# Patient Record
Sex: Female | Born: 1960 | Race: Black or African American | Hispanic: No | Marital: Married | Smoking: Current every day smoker
Health system: Southern US, Community
[De-identification: ages and names within clinical notes are randomized; demographics above are authoritative.]

## PROBLEM LIST (undated history)

## (undated) DIAGNOSIS — E7800 Pure hypercholesterolemia, unspecified: Secondary | ICD-10-CM

## (undated) DIAGNOSIS — J302 Other seasonal allergic rhinitis: Secondary | ICD-10-CM

## (undated) DIAGNOSIS — I10 Essential (primary) hypertension: Secondary | ICD-10-CM

## (undated) DIAGNOSIS — K219 Gastro-esophageal reflux disease without esophagitis: Secondary | ICD-10-CM

## (undated) HISTORY — DX: Pure hypercholesterolemia, unspecified: E78.00

## (undated) HISTORY — PX: APPENDECTOMY: SHX54

## (undated) HISTORY — DX: Gastro-esophageal reflux disease without esophagitis: K21.9

## (undated) HISTORY — DX: Essential (primary) hypertension: I10

---

## 2000-12-29 ENCOUNTER — Encounter: Payer: Self-pay | Admitting: General Surgery

## 2000-12-29 ENCOUNTER — Ambulatory Visit (HOSPITAL_COMMUNITY): Admission: RE | Admit: 2000-12-29 | Discharge: 2000-12-29 | Payer: Self-pay | Admitting: General Surgery

## 2002-11-07 ENCOUNTER — Ambulatory Visit (HOSPITAL_COMMUNITY): Admission: RE | Admit: 2002-11-07 | Discharge: 2002-11-07 | Payer: Self-pay | Admitting: General Surgery

## 2002-11-07 ENCOUNTER — Encounter: Payer: Self-pay | Admitting: General Surgery

## 2003-11-12 ENCOUNTER — Ambulatory Visit (HOSPITAL_COMMUNITY): Admission: RE | Admit: 2003-11-12 | Discharge: 2003-11-12 | Payer: Self-pay | Admitting: Obstetrics and Gynecology

## 2004-01-17 ENCOUNTER — Emergency Department (HOSPITAL_COMMUNITY): Admission: EM | Admit: 2004-01-17 | Discharge: 2004-01-17 | Payer: Self-pay | Admitting: Emergency Medicine

## 2004-11-23 ENCOUNTER — Ambulatory Visit (HOSPITAL_COMMUNITY): Admission: RE | Admit: 2004-11-23 | Discharge: 2004-11-23 | Payer: Self-pay | Admitting: General Surgery

## 2005-12-23 ENCOUNTER — Ambulatory Visit (HOSPITAL_COMMUNITY): Admission: RE | Admit: 2005-12-23 | Discharge: 2005-12-23 | Payer: Self-pay | Admitting: Nurse Practitioner

## 2006-07-11 ENCOUNTER — Emergency Department (HOSPITAL_COMMUNITY): Admission: EM | Admit: 2006-07-11 | Discharge: 2006-07-11 | Payer: Self-pay | Admitting: Emergency Medicine

## 2007-01-09 ENCOUNTER — Ambulatory Visit (HOSPITAL_COMMUNITY): Admission: RE | Admit: 2007-01-09 | Discharge: 2007-01-09 | Payer: Self-pay | Admitting: Nurse Practitioner

## 2008-04-03 ENCOUNTER — Ambulatory Visit (HOSPITAL_COMMUNITY): Admission: RE | Admit: 2008-04-03 | Discharge: 2008-04-03 | Payer: Self-pay | Admitting: Nurse Practitioner

## 2009-04-17 ENCOUNTER — Ambulatory Visit (HOSPITAL_COMMUNITY)
Admission: RE | Admit: 2009-04-17 | Discharge: 2009-04-17 | Payer: Self-pay | Source: Home / Self Care | Admitting: Nurse Practitioner

## 2010-04-03 ENCOUNTER — Other Ambulatory Visit (HOSPITAL_COMMUNITY): Payer: Self-pay | Admitting: Nurse Practitioner

## 2010-04-03 DIAGNOSIS — Z1239 Encounter for other screening for malignant neoplasm of breast: Secondary | ICD-10-CM

## 2010-04-05 ENCOUNTER — Encounter: Payer: Self-pay | Admitting: Nurse Practitioner

## 2010-04-20 ENCOUNTER — Ambulatory Visit (HOSPITAL_COMMUNITY): Admission: RE | Admit: 2010-04-20 | Payer: Self-pay | Source: Home / Self Care | Admitting: Nurse Practitioner

## 2010-04-20 ENCOUNTER — Ambulatory Visit (HOSPITAL_COMMUNITY): Payer: Self-pay

## 2010-04-20 ENCOUNTER — Ambulatory Visit (HOSPITAL_COMMUNITY)
Admission: RE | Admit: 2010-04-20 | Discharge: 2010-04-20 | Disposition: A | Discharge status: Home / Self Care | Payer: BC Managed Care – PPO | Source: Ambulatory Visit | Attending: Nurse Practitioner | Admitting: Nurse Practitioner

## 2010-04-20 DIAGNOSIS — Z1231 Encounter for screening mammogram for malignant neoplasm of breast: Secondary | ICD-10-CM | POA: Insufficient documentation

## 2010-04-20 DIAGNOSIS — Z1239 Encounter for other screening for malignant neoplasm of breast: Secondary | ICD-10-CM

## 2010-06-16 ENCOUNTER — Telehealth: Payer: Self-pay

## 2010-06-16 NOTE — Telephone Encounter (Signed)
Gastroenterology Pre-Procedure Form  Request Date: 06/11/2010,  Requesting Physician: Ninfa Linden fnp     PATIENT INFORMATION:  Jennifer Browning is a 50 y.o., female (DOB=Apr 15, 1960).  PROCEDURE: Procedure(s) requested: colonoscopy Procedure Reason: screening for colon cancer  PATIENT REVIEW QUESTIONS: The patient reports the following:   1. Diabetes Melitis: no 2. Joint replacements in the past 12 months: no 3. Major health problems in the past 3 months: no 4. Has an artificial valve or MVP:no 5. Has been advised in past to take antibiotics in advance of a procedure like teeth cleaning: no}    MEDICATIONS & ALLERGIES:    Patient reports the following regarding taking any blood thinners:   Plavix? no Aspirin?yes 81 mg Coumadin?  no  Patient confirms/reports the following medications:  Current Outpatient Prescriptions  Medication Sig Dispense Refill  . amLODipine-benazepril (LOTREL) 10-40 MG per capsule Take 1 capsule by mouth daily.        Marland Kitchen aspirin 81 MG tablet Take 81 mg by mouth daily.        Marland Kitchen FLUoxetine (PROZAC) 20 MG capsule Take 20 mg by mouth daily.        Marland Kitchen ibuprofen (ADVIL,MOTRIN) 200 MG tablet Take 200 mg by mouth every 6 (six) hours as needed. Just as needed       . losartan (COZAAR) 100 MG tablet Take 100 mg by mouth daily.        . metoprolol tartrate (LOPRESSOR) 25 MG tablet Take 25 mg by mouth daily.        Marland Kitchen omeprazole (PRILOSEC) 20 MG capsule Take 20 mg by mouth daily.        . pravastatin (PRAVACHOL) 20 MG tablet Take 20 mg by mouth daily.          Patient confirms/reports the following allergies:  No Known Allergies  Patient is appropriate to schedule for requested procedure(s): yes  AUTHORIZATION INFORMATION Primary Insurance: ,  ID #: ,  Group #:  Pre-Cert / Auth required: n/a Pre-Cert / Auth #:     Orders Placed This Encounter  Procedures  . Endoscopy, colon, diagnostic    Standing Status: Future     Number of Occurrences:    Standing Expiration Date: 06/16/2011    Order Specific Question:  Pre-op diagnosis    Answer:  screenng    Order Specific Question:  Pre-op visit required?    Answer:  No [0]    SCHEDULE INFORMATION: Procedure has been scheduled as follows:  Date: 07/08/2010, Time: 9:45 AM  Location: [facility] Short Stay  This Gastroenterology Pre-Precedure Form is being routed to the following provider(s) for review: R. Roetta Sessions, MD

## 2010-06-21 NOTE — Telephone Encounter (Signed)
Ok as is; Reviewed by R. Michael Aanya Haynes, MD FACP FACG 

## 2010-06-21 NOTE — Telephone Encounter (Signed)
Reviewed by R. Michael Keelia Graybill, MD FACP FACG 

## 2010-06-23 NOTE — Telephone Encounter (Signed)
A user error has taken place: encounter opened in error, closed for administrative reasons.

## 2010-07-08 ENCOUNTER — Encounter: Payer: BC Managed Care – PPO | Admitting: Internal Medicine

## 2010-07-08 ENCOUNTER — Other Ambulatory Visit: Payer: Self-pay | Admitting: Internal Medicine

## 2010-07-08 ENCOUNTER — Ambulatory Visit (HOSPITAL_COMMUNITY)
Admission: RE | Admit: 2010-07-08 | Discharge: 2010-07-08 | Disposition: A | Discharge status: Home / Self Care | Payer: BC Managed Care – PPO | Source: Ambulatory Visit | Attending: Internal Medicine | Admitting: Internal Medicine

## 2010-07-08 DIAGNOSIS — Z1211 Encounter for screening for malignant neoplasm of colon: Secondary | ICD-10-CM

## 2010-07-08 DIAGNOSIS — D126 Benign neoplasm of colon, unspecified: Secondary | ICD-10-CM

## 2010-07-08 DIAGNOSIS — Z79899 Other long term (current) drug therapy: Secondary | ICD-10-CM | POA: Insufficient documentation

## 2010-07-08 DIAGNOSIS — I10 Essential (primary) hypertension: Secondary | ICD-10-CM | POA: Insufficient documentation

## 2010-07-08 DIAGNOSIS — Z7982 Long term (current) use of aspirin: Secondary | ICD-10-CM | POA: Insufficient documentation

## 2010-07-08 DIAGNOSIS — E785 Hyperlipidemia, unspecified: Secondary | ICD-10-CM | POA: Insufficient documentation

## 2010-07-08 HISTORY — PX: COLONOSCOPY: SHX174

## 2010-07-10 ENCOUNTER — Encounter: Payer: Self-pay | Admitting: Internal Medicine

## 2010-07-14 NOTE — Op Note (Signed)
  Jennifer Browning, Jennifer Browning         ACCOUNT NO.:  [id number]  MEDICAL RECORD NO.:  [id number]           PATIENT TYPE:  O  LOCATION:  [facility]                          FACILITY:  [facility]  PHYSICIAN:  R. Roetta Sessions, M.D. DATE OF BIRTH:  12/25/1960  DATE OF PROCEDURE:  07/08/2010 DATE OF DISCHARGE:                              OPERATIVE REPORT   PROCEDURE:  Colonoscopy with piecemeal snare polypectomy followed by hemostasis clipping.  INDICATIONS FOR PROCEDURE:  A 50 year old lady with no lower GI tract symptoms, sent over at the courtesy of Ninfa Linden, FNP for colorectal cancer screening.  No family history of polyps or colon cancer.  No prior imaging.  Colonoscopy is now being done as screening maneuver.  Risks, benefits, limitations, alternatives, imponderables have been reviewed today in the office.  Please see the documentation in the medical record for more information.  PROCEDURE NOTE:  O2 saturation, blood pressure, pulse, respirations were monitored throughout the entire procedure.  CONSCIOUS SEDATION:  Versed 6 mg IV, Demerol 100 mg IV in divided doses.  INSTRUMENT:  Pentax video chip system.  FINDINGS:  Digital rectal exam revealed no abnormalities.  Endoscopic findings:  Prep was good.  Colon:  Colonic mucosa was surveyed from the rectosigmoid junction through the left transverse right colon to the appendiceal orifice, ileocecal valve/cecum.  These structures were well seen and photographed for the record.  From this level, scope was slowly withdrawn.  All previously mentioned mucosal surfaces were again seen. The patient had 4 x 8 mm polyp straddling a fold just distal and at the base of the ileocecal valve.  Please see photos.  It is difficult to approach with the snare.  Ultimately, two passes with the snare were made to remove it and we had problems with good connections with the grounding and cautery was limited.  There was some oozing at the site which was  treated very effectively with the application one hemostasis clip.  Remainder of colonic mucosa appeared normal.  Scope was pulled down to the rectum where a thorough examination of rectal mucosa including retroflexed view of the anal verge demonstrated no abnormalities.  The patient tolerated the procedure well.  Cecal withdrawal time 18 minutes.  IMPRESSION:  Normal rectum, polyp at the ileocecal valve status post piecemeal snare polypectomy followed by resolution and clipping. Remainder of colonic mucosa appeared normal.  RECOMMENDATION: 1. No aspirin or arthritis medications for 7 days. 2. Follow up on path. 3. No MRI until clip known to have passed.     Jonathon Bellows, M.D.     RMR/MEDQ  D:  07/08/2010  T:  07/08/2010  Job:  [id number]  cc:   Ninfa Linden, FNP [place], [place]  Electronically Signed by Lorrin Goodell M.D. on 07/14/2010 07:51:38 PM

## 2011-03-18 ENCOUNTER — Other Ambulatory Visit (HOSPITAL_COMMUNITY): Payer: Self-pay | Admitting: Nurse Practitioner

## 2011-03-18 DIAGNOSIS — Z139 Encounter for screening, unspecified: Secondary | ICD-10-CM

## 2011-04-26 ENCOUNTER — Ambulatory Visit (HOSPITAL_COMMUNITY)
Admission: RE | Admit: 2011-04-26 | Discharge: 2011-04-26 | Disposition: A | Discharge status: Home / Self Care | Payer: BC Managed Care – PPO | Source: Ambulatory Visit | Attending: Nurse Practitioner | Admitting: Nurse Practitioner

## 2011-04-26 DIAGNOSIS — Z139 Encounter for screening, unspecified: Secondary | ICD-10-CM

## 2011-04-26 DIAGNOSIS — Z1231 Encounter for screening mammogram for malignant neoplasm of breast: Secondary | ICD-10-CM | POA: Insufficient documentation

## 2012-05-04 ENCOUNTER — Other Ambulatory Visit (HOSPITAL_COMMUNITY): Payer: Self-pay | Admitting: Nurse Practitioner

## 2012-05-09 ENCOUNTER — Ambulatory Visit (HOSPITAL_COMMUNITY)
Admission: RE | Admit: 2012-05-09 | Discharge: 2012-05-09 | Disposition: A | Discharge status: Home / Self Care | Payer: BC Managed Care – PPO | Source: Ambulatory Visit | Attending: Nurse Practitioner | Admitting: Nurse Practitioner

## 2012-05-09 DIAGNOSIS — Z1231 Encounter for screening mammogram for malignant neoplasm of breast: Secondary | ICD-10-CM | POA: Insufficient documentation

## 2013-05-29 ENCOUNTER — Other Ambulatory Visit (HOSPITAL_COMMUNITY): Payer: Self-pay | Admitting: Nurse Practitioner

## 2013-05-29 DIAGNOSIS — Z139 Encounter for screening, unspecified: Secondary | ICD-10-CM

## 2013-06-04 ENCOUNTER — Ambulatory Visit (HOSPITAL_COMMUNITY)
Admission: RE | Admit: 2013-06-04 | Discharge: 2013-06-04 | Disposition: A | Discharge status: Home / Self Care | Payer: BC Managed Care – PPO | Source: Ambulatory Visit | Attending: Nurse Practitioner | Admitting: Nurse Practitioner

## 2013-06-04 DIAGNOSIS — Z1231 Encounter for screening mammogram for malignant neoplasm of breast: Secondary | ICD-10-CM | POA: Insufficient documentation

## 2013-06-04 DIAGNOSIS — Z139 Encounter for screening, unspecified: Secondary | ICD-10-CM

## 2013-10-31 ENCOUNTER — Encounter: Payer: Self-pay | Admitting: Gastroenterology

## 2013-10-31 ENCOUNTER — Ambulatory Visit (INDEPENDENT_AMBULATORY_CARE_PROVIDER_SITE_OTHER): Payer: BC Managed Care – PPO | Admitting: Gastroenterology

## 2013-10-31 ENCOUNTER — Encounter (HOSPITAL_COMMUNITY): Payer: Self-pay | Admitting: Pharmacy Technician

## 2013-10-31 VITALS — BP 131/69 | HR 73 | Temp 98.1°F | Ht 67.0 in | Wt 183.6 lb

## 2013-10-31 DIAGNOSIS — K5900 Constipation, unspecified: Secondary | ICD-10-CM | POA: Insufficient documentation

## 2013-10-31 DIAGNOSIS — Z860100 Personal history of colon polyps, unspecified: Secondary | ICD-10-CM | POA: Insufficient documentation

## 2013-10-31 MED ORDER — HYDROCORTISONE 2.5 % RE CREA
1.0000 | TOPICAL_CREAM | Freq: Two times a day (BID) | RECTAL | Status: DC
Start: 2013-10-31 — End: 2016-11-29

## 2013-10-31 MED ORDER — LINACLOTIDE 145 MCG PO CAPS: 145.0000 ug | ORAL_CAPSULE | Freq: Every day | ORAL | Status: DC

## 2013-10-31 MED ORDER — PEG 3350-KCL-NA BICARB-NACL 420 G PO SOLR: 4000.0000 mL | ORAL | Status: DC

## 2013-10-31 NOTE — Patient Instructions (Signed)
We have scheduled you for a colonoscopy with Dr. Gala Romney in the near future.  For constipation: start taking Linzess 1 capsule each morning, 30 minutes before breakfast. I have provided a voucher for a month free and refills.   You may use the hemorrhoid cream twice a day for 7 days. Avoid straining.

## 2013-10-31 NOTE — Progress Notes (Signed)
Primary Care Physician:  Velta Addison, Claretha Cooper, DO Primary Gastroenterologist:  Dr. Gala Romney   Chief Complaint  Patient presents with  . Colonoscopy    HPI:   Jennifer Browning presents today to schedule surveillance colonoscopy. History of tubular adenoma with piecemeal polypectomy in 2012, needing 3 year surveillance at this time. Feels constipated, non-productive stool. Feels like she isn't getting relief like she should. No rectal bleeding. No melena. Abdominal discomfort associated with constipation. No weight loss or lack of appetite. Takes omeprazole daily for GERD.  No N/V. Throat feels sore when swallowing. Denies globus sensation. Lots of sinus drainage. Coughing all the time. No solid food dysphagia.   Notes occasional issues with hemorrhoids. Will poke out.   Past Medical History  Diagnosis Date  . GERD (gastroesophageal reflux disease)   . Hypertension   . Hypercholesterolemia     Past Surgical History  Procedure Laterality Date  . Colonoscopy  07/08/2010    Dr. Rourk:Normal rectum, polyp at the ileocecal valve status post piecemeal snare polypectomy followed by resolution and clipping Remainder of colonic mucosa appeared normal. PATH: tubular adenoma  . Appendectomy      Current Outpatient Prescriptions  Medication Sig Dispense Refill  . amLODipine-benazepril (LOTREL) 10-40 MG per capsule Take 1 capsule by mouth daily.        Marland Kitchen aspirin 81 MG tablet Take 81 mg by mouth daily.        Marland Kitchen FLUoxetine (PROZAC) 20 MG capsule Take 20 mg by mouth daily.        Marland Kitchen ibuprofen (ADVIL,MOTRIN) 200 MG tablet Take 200 mg by mouth every 6 (six) hours as needed. Just as needed       . losartan (COZAAR) 100 MG tablet Take 100 mg by mouth daily.        . metoprolol tartrate (LOPRESSOR) 25 MG tablet Take 25 mg by mouth daily.        Marland Kitchen omeprazole (PRILOSEC) 20 MG capsule Take 20 mg by mouth daily.        . pravastatin (PRAVACHOL) 20 MG tablet Take 20 mg by mouth daily.        .  hydrocortisone (ANUSOL-HC) 2.5 % rectal cream Place 1 application rectally 2 (two) times daily.  30 g  1  . Linaclotide (LINZESS) 145 MCG CAPS capsule Take 1 capsule (145 mcg total) by mouth daily. 30 minutes before breakfast  30 capsule  3   No current facility-administered medications for this visit.    Allergies as of 10/31/2013  . (No Known Allergies)    Family History  Problem Relation Age of Onset  . Colon cancer Neg Hx     History   Social History  . Marital Status: Married    Spouse Name: N/A    Number of Children: N/A  . Years of Education: N/A   Occupational History  . Not on file.   Social History Main Topics  . Smoking status: Current Some Day Smoker -- 1.00 packs/day    Types: Cigarettes  . Smokeless tobacco: Not on file  . Alcohol Use: No     Comment: a drink of brandy 2-3 times per week  . Drug Use: No  . Sexual Activity: Not on file   Other Topics Concern  . Not on file   Social History Narrative  . No narrative on file    Review of Systems: See HPI  Physical Exam: BP 131/69  Pulse 73  Temp(Src) 98.1 F (  36.7 C) (Oral)  Ht 5\' 7"  (1.702 m)  Wt 183 lb 9.6 oz (83.28 kg)  BMI 28.75 kg/m2 General:   Alert and oriented. Pleasant and cooperative. Well-nourished and well-developed.  Head:  Normocephalic and atraumatic. Eyes:  Without icterus, sclera clear and conjunctiva pink.  Ears:  Normal auditory acuity. Nose:  No deformity, discharge,  or lesions. Mouth:  No deformity or lesions, oral mucosa pink.  Lungs:  Clear to auscultation bilaterally. No wheezes, rales, or rhonchi. No distress.  Heart:  S1, S2 present without murmurs appreciated.  Abdomen:  +BS, soft, non-tender and non-distended. No HSM noted. No guarding or rebound. No masses appreciated.  Rectal:  Small, non-thrombosed external hemorrhoid at 6'oclock anteriorly. Internal exam without mass.  Msk:  Symmetrical without gross deformities. Normal posture. Extremities:  Without  edema. Neurologic:  Alert and  oriented x4;  grossly normal neurologically. Skin:  Intact without significant lesions or rashes. Psych:  Alert and cooperative. Normal mood and affect.

## 2013-11-02 ENCOUNTER — Encounter: Payer: Self-pay | Admitting: Gastroenterology

## 2013-11-02 NOTE — Assessment & Plan Note (Signed)
53 year old female with tubular adenoma removed via piecemeal fashion in 2012, due for surveillance now. Proceed with TCS with Dr. Gala Browning in near future: the risks, benefits, and alternatives have been discussed with the patient in detail. The patient states understanding and desires to proceed.

## 2013-11-02 NOTE — Assessment & Plan Note (Signed)
Without rectal bleeding or other concerning signs. Chronic. Start Linzess 145 mcg daily. Anusol BID for hemorrhoids. Consider banding as outpatient.

## 2013-11-14 ENCOUNTER — Ambulatory Visit (HOSPITAL_COMMUNITY)
Admission: RE | Admit: 2013-11-14 | Discharge: 2013-11-14 | Disposition: A | Discharge status: Home / Self Care | Payer: BC Managed Care – PPO | Source: Ambulatory Visit | Attending: Internal Medicine | Admitting: Internal Medicine

## 2013-11-14 ENCOUNTER — Encounter (HOSPITAL_COMMUNITY)
Admission: RE | Disposition: A | Discharge status: Home / Self Care | Payer: Self-pay | Source: Ambulatory Visit | Attending: Internal Medicine

## 2013-11-14 ENCOUNTER — Encounter (HOSPITAL_COMMUNITY): Payer: Self-pay | Admitting: *Deleted

## 2013-11-14 DIAGNOSIS — Z860100 Personal history of colon polyps, unspecified: Secondary | ICD-10-CM | POA: Insufficient documentation

## 2013-11-14 DIAGNOSIS — K219 Gastro-esophageal reflux disease without esophagitis: Secondary | ICD-10-CM | POA: Insufficient documentation

## 2013-11-14 DIAGNOSIS — Z79899 Other long term (current) drug therapy: Secondary | ICD-10-CM | POA: Insufficient documentation

## 2013-11-14 DIAGNOSIS — Z1211 Encounter for screening for malignant neoplasm of colon: Secondary | ICD-10-CM | POA: Diagnosis not present

## 2013-11-14 DIAGNOSIS — E7800 Pure hypercholesterolemia, unspecified: Secondary | ICD-10-CM | POA: Diagnosis not present

## 2013-11-14 DIAGNOSIS — Z7982 Long term (current) use of aspirin: Secondary | ICD-10-CM | POA: Insufficient documentation

## 2013-11-14 DIAGNOSIS — D126 Benign neoplasm of colon, unspecified: Secondary | ICD-10-CM | POA: Diagnosis not present

## 2013-11-14 DIAGNOSIS — K5900 Constipation, unspecified: Secondary | ICD-10-CM

## 2013-11-14 DIAGNOSIS — I10 Essential (primary) hypertension: Secondary | ICD-10-CM | POA: Insufficient documentation

## 2013-11-14 HISTORY — PX: COLONOSCOPY: SHX5424

## 2013-11-14 SURGERY — COLONOSCOPY
Anesthesia: Moderate Sedation

## 2013-11-14 MED ORDER — SODIUM CHLORIDE 0.9 % IJ SOLN
INTRAMUSCULAR | Status: AC
  Filled 2013-11-14: qty 10

## 2013-11-14 MED ORDER — MIDAZOLAM HCL 5 MG/5ML IJ SOLN
INTRAMUSCULAR | Status: DC | PRN
  Administered 2013-11-14: 1 mg via INTRAVENOUS
  Administered 2013-11-14: 2 mg via INTRAVENOUS

## 2013-11-14 MED ORDER — SODIUM CHLORIDE 0.9 % IV SOLN
INTRAVENOUS | Status: DC
  Administered 2013-11-14: 08:00:00 via INTRAVENOUS

## 2013-11-14 MED ORDER — MIDAZOLAM HCL 5 MG/5ML IJ SOLN
INTRAMUSCULAR | Status: AC
  Filled 2013-11-14: qty 10

## 2013-11-14 MED ORDER — ONDANSETRON HCL 4 MG/2ML IJ SOLN
INTRAMUSCULAR | Status: DC
Start: 2013-11-14 — End: 2013-11-14
  Filled 2013-11-14: qty 2

## 2013-11-14 MED ORDER — MEPERIDINE HCL 100 MG/ML IJ SOLN
INTRAMUSCULAR | Status: DC | PRN
Start: 2013-11-14 — End: 2013-11-14
  Administered 2013-11-14: 25 mg via INTRAVENOUS
  Administered 2013-11-14: 50 mg via INTRAVENOUS

## 2013-11-14 MED ORDER — STERILE WATER FOR IRRIGATION IR SOLN
Status: DC | PRN
  Administered 2013-11-14: 09:00:00

## 2013-11-14 MED ORDER — PROMETHAZINE HCL 25 MG/ML IJ SOLN
25.0000 mg | Freq: Once | INTRAMUSCULAR | Status: AC
  Administered 2013-11-14: 25 mg via INTRAVENOUS
  Filled 2013-11-14: qty 1

## 2013-11-14 MED ORDER — ONDANSETRON HCL 4 MG/2ML IJ SOLN
INTRAMUSCULAR | Status: DC | PRN
  Administered 2013-11-14: 4 mg via INTRAVENOUS

## 2013-11-14 MED ORDER — MEPERIDINE HCL 100 MG/ML IJ SOLN
INTRAMUSCULAR | Status: AC
  Filled 2013-11-14: qty 2

## 2013-11-14 NOTE — Discharge Instructions (Addendum)
Colonoscopy Discharge Instructions  Read the instructions outlined below and refer to this sheet in the next few weeks. These discharge instructions provide you with general information on caring for yourself after you leave the hospital. Your doctor may also give you specific instructions. While your treatment has been planned according to the most current medical practices available, unavoidable complications occasionally occur. If you have any problems or questions after discharge, call Dr. Gala Romney at [phone]. ACTIVITY  You may resume your regular activity, but move at a slower pace for the next 24 hours.   Take frequent rest periods for the next 24 hours.   Walking will help get rid of the air and reduce the bloated feeling in your belly (abdomen).   No driving for 24 hours (because of the medicine (anesthesia) used during the test).    Do not sign any important legal documents or operate any machinery for 24 hours (because of the anesthesia used during the test).  NUTRITION  Drink plenty of fluids.   You may resume your normal diet as instructed by your doctor.   Begin with a light meal and progress to your normal diet. Heavy or fried foods are harder to digest and may make you feel sick to your stomach (nauseated).   Avoid alcoholic beverages for 24 hours or as instructed.  MEDICATIONS  You may resume your normal medications unless your doctor tells you otherwise.  WHAT YOU CAN EXPECT TODAY  Some feelings of bloating in the abdomen.   Passage of more gas than usual.   Spotting of blood in your stool or on the toilet paper.  IF YOU HAD POLYPS REMOVED DURING THE COLONOSCOPY:  No aspirin products for 7 days or as instructed.   No alcohol for 7 days or as instructed.   Eat a soft diet for the next 24 hours.  FINDING OUT THE RESULTS OF YOUR TEST Not all test results are available during your visit. If your test results are not back during the visit, make an appointment  with your caregiver to find out the results. Do not assume everything is normal if you have not heard from your caregiver or the medical facility. It is important for you to follow up on all of your test results.  SEEK IMMEDIATE MEDICAL ATTENTION IF:  You have more than a spotting of blood in your stool.   Your belly is swollen (abdominal distention).   You are nauseated or vomiting.   You have a temperature over 101.   You have abdominal pain or discomfort that is severe or gets worse throughout the day.   Colon Polyps Polyps are lumps of extra tissue growing inside the body. Polyps can grow in the large intestine (colon). Most colon polyps are noncancerous (benign). However, some colon polyps can become cancerous over time. Polyps that are larger than a pea may be harmful. To be safe, caregivers remove and test all polyps. CAUSES  Polyps form when mutations in the genes cause your cells to grow and divide even though no more tissue is needed. RISK FACTORS There are a number of risk factors that can increase your chances of getting colon polyps. They include:  Being older than 50 years.  Family history of colon polyps or colon cancer.  Long-term colon diseases, such as colitis or Crohn disease.  Being overweight.  Smoking.  Being inactive.  Drinking too much alcohol. SYMPTOMS  Most small polyps do not cause symptoms. If symptoms are present, they may include:  Blood in the stool. The stool may look dark red or black.  Constipation or diarrhea that lasts longer than 1 week. DIAGNOSIS People often do not know they have polyps until their caregiver finds them during a regular checkup. Your caregiver can use 4 tests to check for polyps:  Digital rectal exam. The caregiver wears gloves and feels inside the rectum. This test would find polyps only in the rectum.  Barium enema. The caregiver puts a liquid called barium into your rectum before taking X-rays of your colon. Barium  makes your colon look white. Polyps are dark, so they are easy to see in the X-ray pictures.  Sigmoidoscopy. A thin, flexible tube (sigmoidoscope) is placed into your rectum. The sigmoidoscope has a light and tiny camera in it. The caregiver uses the sigmoidoscope to look at the last third of your colon.  Colonoscopy. This test is like sigmoidoscopy, but the caregiver looks at the entire colon. This is the most common method for finding and removing polyps. TREATMENT  Any polyps will be removed during a sigmoidoscopy or colonoscopy. The polyps are then tested for cancer. PREVENTION  To help lower your risk of getting more colon polyps:  Eat plenty of fruits and vegetables. Avoid eating fatty foods.  Do not smoke.  Avoid drinking alcohol.  Exercise every day.  Lose weight if recommended by your caregiver.  Eat plenty of calcium and folate. Foods that are rich in calcium include milk, cheese, and broccoli. Foods that are rich in folate include chickpeas, kidney beans, and spinach. HOME CARE INSTRUCTIONS Keep all follow-up appointments as directed by your caregiver. You may need periodic exams to check for polyps. SEEK MEDICAL CARE IF: You notice bleeding during a bowel movement. Document Released: 11/26/2003 Document Revised: 05/24/2011 Document Reviewed: 05/11/2011 [facility] Patient Information 2015 [place], [place]. This information is not intended to replace advice given to you by your health care provider. Make sure you discuss any questions you have with your health care provider.   Polyp information provided  Further recommendations to follow pending review of pathology  Colon Polyps Polyps are lumps of extra tissue growing inside the body. Polyps can grow in the large intestine (colon). Most colon polyps are noncancerous (benign). However, some colon polyps can become cancerous over time. Polyps that are larger than a pea may be harmful. To be safe, caregivers remove and test all  polyps. CAUSES  Polyps form when mutations in the genes cause your cells to grow and divide even though no more tissue is needed. RISK FACTORS There are a number of risk factors that can increase your chances of getting colon polyps. They include:  Being older than 50 years.  Family history of colon polyps or colon cancer.  Long-term colon diseases, such as colitis or Crohn disease.  Being overweight.  Smoking.  Being inactive.  Drinking too much alcohol. SYMPTOMS  Most small polyps do not cause symptoms. If symptoms are present, they may include:  Blood in the stool. The stool may look dark red or black.  Constipation or diarrhea that lasts longer than 1 week. DIAGNOSIS People often do not know they have polyps until their caregiver finds them during a regular checkup. Your caregiver can use 4 tests to check for polyps:  Digital rectal exam. The caregiver wears gloves and feels inside the rectum. This test would find polyps only in the rectum.  Barium enema. The caregiver puts a liquid called barium into your rectum before taking X-rays of your colon.  Barium makes your colon look white. Polyps are dark, so they are easy to see in the X-ray pictures.  Sigmoidoscopy. A thin, flexible tube (sigmoidoscope) is placed into your rectum. The sigmoidoscope has a light and tiny camera in it. The caregiver uses the sigmoidoscope to look at the last third of your colon.  Colonoscopy. This test is like sigmoidoscopy, but the caregiver looks at the entire colon. This is the most common method for finding and removing polyps. TREATMENT  Any polyps will be removed during a sigmoidoscopy or colonoscopy. The polyps are then tested for cancer. PREVENTION  To help lower your risk of getting more colon polyps:  Eat plenty of fruits and vegetables. Avoid eating fatty foods.  Do not smoke.  Avoid drinking alcohol.  Exercise every day.  Lose weight if recommended by your caregiver.  Eat  plenty of calcium and folate. Foods that are rich in calcium include milk, cheese, and broccoli. Foods that are rich in folate include chickpeas, kidney beans, and spinach. HOME CARE INSTRUCTIONS Keep all follow-up appointments as directed by your caregiver. You may need periodic exams to check for polyps. SEEK MEDICAL CARE IF: You notice bleeding during a bowel movement. Document Released: 11/26/2003 Document Revised: 05/24/2011 Document Reviewed: 05/11/2011 [facility] Patient Information 2015 [place], [place]. This information is not intended to replace advice given to you by your health care provider. Make sure you discuss any questions you have with your health care provider.

## 2013-11-14 NOTE — H&P (View-Only) (Signed)
Primary Care Physician:  Velta Addison, Claretha Cooper, DO Primary Gastroenterologist:  Dr. Gala Romney   Chief Complaint  Patient presents with  . Colonoscopy    HPI:   Jennifer Browning presents today to schedule surveillance colonoscopy. History of tubular adenoma with piecemeal polypectomy in 2012, needing 3 year surveillance at this time. Feels constipated, non-productive stool. Feels like she isn't getting relief like she should. No rectal bleeding. No melena. Abdominal discomfort associated with constipation. No weight loss or lack of appetite. Takes omeprazole daily for GERD.  No N/V. Throat feels sore when swallowing. Denies globus sensation. Lots of sinus drainage. Coughing all the time. No solid food dysphagia.   Notes occasional issues with hemorrhoids. Will poke out.   Past Medical History  Diagnosis Date  . GERD (gastroesophageal reflux disease)   . Hypertension   . Hypercholesterolemia     Past Surgical History  Procedure Laterality Date  . Colonoscopy  07/08/2010    Dr. Rourk:Normal rectum, polyp at the ileocecal valve status post piecemeal snare polypectomy followed by resolution and clipping Remainder of colonic mucosa appeared normal. PATH: tubular adenoma  . Appendectomy      Current Outpatient Prescriptions  Medication Sig Dispense Refill  . amLODipine-benazepril (LOTREL) 10-40 MG per capsule Take 1 capsule by mouth daily.        Marland Kitchen aspirin 81 MG tablet Take 81 mg by mouth daily.        Marland Kitchen FLUoxetine (PROZAC) 20 MG capsule Take 20 mg by mouth daily.        Marland Kitchen ibuprofen (ADVIL,MOTRIN) 200 MG tablet Take 200 mg by mouth every 6 (six) hours as needed. Just as needed       . losartan (COZAAR) 100 MG tablet Take 100 mg by mouth daily.        . metoprolol tartrate (LOPRESSOR) 25 MG tablet Take 25 mg by mouth daily.        Marland Kitchen omeprazole (PRILOSEC) 20 MG capsule Take 20 mg by mouth daily.        . pravastatin (PRAVACHOL) 20 MG tablet Take 20 mg by mouth daily.        .  hydrocortisone (ANUSOL-HC) 2.5 % rectal cream Place 1 application rectally 2 (two) times daily.  30 g  1  . Linaclotide (LINZESS) 145 MCG CAPS capsule Take 1 capsule (145 mcg total) by mouth daily. 30 minutes before breakfast  30 capsule  3   No current facility-administered medications for this visit.    Allergies as of 10/31/2013  . (No Known Allergies)    Family History  Problem Relation Age of Onset  . Colon cancer Neg Hx     History   Social History  . Marital Status: Married    Spouse Name: N/A    Number of Children: N/A  . Years of Education: N/A   Occupational History  . Not on file.   Social History Main Topics  . Smoking status: Current Some Day Smoker -- 1.00 packs/day    Types: Cigarettes  . Smokeless tobacco: Not on file  . Alcohol Use: No     Comment: a drink of brandy 2-3 times per week  . Drug Use: No  . Sexual Activity: Not on file   Other Topics Concern  . Not on file   Social History Narrative  . No narrative on file    Review of Systems: See HPI  Physical Exam: BP 131/69  Pulse 73  Temp(Src) 98.1 F (  36.7 C) (Oral)  Ht 5\' 7"  (1.702 m)  Wt 183 lb 9.6 oz (83.28 kg)  BMI 28.75 kg/m2 General:   Alert and oriented. Pleasant and cooperative. Well-nourished and well-developed.  Head:  Normocephalic and atraumatic. Eyes:  Without icterus, sclera clear and conjunctiva pink.  Ears:  Normal auditory acuity. Nose:  No deformity, discharge,  or lesions. Mouth:  No deformity or lesions, oral mucosa pink.  Lungs:  Clear to auscultation bilaterally. No wheezes, rales, or rhonchi. No distress.  Heart:  S1, S2 present without murmurs appreciated.  Abdomen:  +BS, soft, non-tender and non-distended. No HSM noted. No guarding or rebound. No masses appreciated.  Rectal:  Small, non-thrombosed external hemorrhoid at 6'oclock anteriorly. Internal exam without mass.  Msk:  Symmetrical without gross deformities. Normal posture. Extremities:  Without  edema. Neurologic:  Alert and  oriented x4;  grossly normal neurologically. Skin:  Intact without significant lesions or rashes. Psych:  Alert and cooperative. Normal mood and affect.

## 2013-11-14 NOTE — Interval H&P Note (Signed)
History and Physical Interval Note:  11/14/2013 8:28 AM  Jennifer Browning  has presented today for surgery, with the diagnosis of CONSTIPATION AND COLON POLYPS  The various methods of treatment have been discussed with the patient and family. After consideration of risks, benefits and other options for treatment, the patient has consented to  Procedure(s) with comments: COLONOSCOPY (N/A) - 8:30 as a surgical intervention .  The patient's history has been reviewed, patient examined, no change in status, stable for surgery.  I have reviewed the patient's chart and labs.  Questions were answered to the patient's satisfaction.     No change from has not started Linzess yet. Colonoscopy today per plan.  The risks, benefits, limitations, alternatives and imponderables have been reviewed with the patient. Questions have been answered. All parties are agreeable.   Manus Rudd

## 2013-11-14 NOTE — Op Note (Signed)
[facility] [address] [place], [id number]   COLONOSCOPY PROCEDURE REPORT  PATIENT: Jennifer Browning, Jennifer Browning  MR#:         [id number] BIRTHDATE: [birth year] , 59  yrs. old GENDER: Female ENDOSCOPIST: Bridgette Habermann, MD FACP [facility] REFERRED BY:     Dr. Velta Addison PROCEDURE DATE:  11/14/2013 PROCEDURE:     Colonoscopy with snare polypectomy and biopsy  INDICATIONS: History of colonic adenoma  INFORMED CONSENT:  The risks, benefits, alternatives and imponderables including but not limited to bleeding, perforation as well as the possibility of a missed lesion have been reviewed.  The potential for biopsy, lesion removal, etc. have also been discussed.  Questions have been answered.  All parties agreeable. Please see the history and physical in the medical record for more information.  MEDICATIONS: Versed 3 mg IV and Demerol 75 mg IV in divided doses. Zofran 4 mg IV.  Phenergan 25 mg IV  DESCRIPTION OF PROCEDURE:  After a digital rectal exam was performed, the EC-3890Li ([id number])  colonoscope was advanced from the anus through the rectum and colon to the area of the cecum, ileocecal valve and appendiceal orifice.  The cecum was deeply intubated.  These structures were well-seen and photographed for the record.  From the level of the cecum and ileocecal valve, the scope was slowly and cautiously withdrawn.  The mucosal surfaces were carefully surveyed utilizing scope tip deflection to facilitate fold flattening as needed.  The scope was pulled down into the rectum where a thorough examination including retroflexion was performed.    FINDINGS:  Adequate preparation for Normal rectum.  (1) 1 cm carpet polyp straddling a fold in the ascending segment. There was (1) adjacent diminutive polyp; otherwise, the remainder of colonic mucosa appeared normal.  THERAPEUTIC / DIAGNOSTIC MANEUVERS PERFORMED:  The larger above-mentioned polyp was hot snare removed with one  pass of the loop. Subsequently, the diminutive polyps cold biopsied/removed.  COMPLICATIONS: none  CECAL WITHDRAWAL TIME: 10 minutes  IMPRESSION:  Multiple colonic polyps-removed as described above  RECOMMENDATIONS: Followup on pathology.   _______________________________ eSigned:  R. Garfield Cornea, MD FACP [facility] 11/14/2013 8:56 AM   CC:    PATIENT NAME:  Luisana, Lutzke MR#: [id number]

## 2013-11-16 ENCOUNTER — Encounter: Payer: Self-pay | Admitting: Internal Medicine

## 2013-11-20 ENCOUNTER — Encounter (HOSPITAL_COMMUNITY): Payer: Self-pay | Admitting: Internal Medicine

## 2014-10-04 ENCOUNTER — Encounter (HOSPITAL_COMMUNITY): Payer: Self-pay | Admitting: *Deleted

## 2014-10-04 ENCOUNTER — Emergency Department (HOSPITAL_COMMUNITY)
Admission: EM | Admit: 2014-10-04 | Discharge: 2014-10-04 | Disposition: A | Discharge status: Home / Self Care | Payer: BLUE CROSS/BLUE SHIELD | Attending: Emergency Medicine | Admitting: Emergency Medicine

## 2014-10-04 ENCOUNTER — Emergency Department (HOSPITAL_COMMUNITY): Payer: BLUE CROSS/BLUE SHIELD

## 2014-10-04 DIAGNOSIS — K219 Gastro-esophageal reflux disease without esophagitis: Secondary | ICD-10-CM | POA: Insufficient documentation

## 2014-10-04 DIAGNOSIS — Z720 Tobacco use: Secondary | ICD-10-CM | POA: Diagnosis not present

## 2014-10-04 DIAGNOSIS — K5900 Constipation, unspecified: Secondary | ICD-10-CM | POA: Insufficient documentation

## 2014-10-04 DIAGNOSIS — Z7952 Long term (current) use of systemic steroids: Secondary | ICD-10-CM | POA: Diagnosis not present

## 2014-10-04 DIAGNOSIS — R110 Nausea: Secondary | ICD-10-CM | POA: Diagnosis not present

## 2014-10-04 DIAGNOSIS — R140 Abdominal distension (gaseous): Secondary | ICD-10-CM | POA: Diagnosis not present

## 2014-10-04 DIAGNOSIS — E780 Pure hypercholesterolemia: Secondary | ICD-10-CM | POA: Diagnosis not present

## 2014-10-04 DIAGNOSIS — I10 Essential (primary) hypertension: Secondary | ICD-10-CM | POA: Insufficient documentation

## 2014-10-04 DIAGNOSIS — Z79899 Other long term (current) drug therapy: Secondary | ICD-10-CM | POA: Diagnosis not present

## 2014-10-04 LAB — URINALYSIS, ROUTINE W REFLEX MICROSCOPIC
Bilirubin Urine: NEGATIVE
Glucose, UA: 250 mg/dL — AB
Hgb urine dipstick: NEGATIVE
Ketones, ur: NEGATIVE mg/dL
Leukocytes, UA: NEGATIVE
NITRITE: NEGATIVE
PROTEIN: NEGATIVE mg/dL
Urobilinogen, UA: 0.2 mg/dL (ref 0.0–1.0)
pH: 6 (ref 5.0–8.0)

## 2014-10-04 LAB — COMPREHENSIVE METABOLIC PANEL
ALBUMIN: 4.1 g/dL (ref 3.5–5.0)
ALT: 28 U/L (ref 14–54)
ANION GAP: 10 (ref 5–15)
AST: 35 U/L (ref 15–41)
Alkaline Phosphatase: 80 U/L (ref 38–126)
BUN: 10 mg/dL (ref 6–20)
CALCIUM: 9.2 mg/dL (ref 8.9–10.3)
CO2: 22 mmol/L (ref 22–32)
CREATININE: 0.77 mg/dL (ref 0.44–1.00)
Chloride: 105 mmol/L (ref 101–111)
Glucose, Bld: 208 mg/dL — ABNORMAL HIGH (ref 65–99)
Potassium: 3.6 mmol/L (ref 3.5–5.1)
SODIUM: 137 mmol/L (ref 135–145)
Total Bilirubin: 0.5 mg/dL (ref 0.3–1.2)
Total Protein: 7.2 g/dL (ref 6.5–8.1)

## 2014-10-04 LAB — CBC WITH DIFFERENTIAL/PLATELET
Basophils Absolute: 0 10*3/uL (ref 0.0–0.1)
Basophils Relative: 1 % (ref 0–1)
EOS ABS: 0.1 10*3/uL (ref 0.0–0.7)
EOS PCT: 2 % (ref 0–5)
HCT: 40.6 % (ref 36.0–46.0)
HEMOGLOBIN: 13.4 g/dL (ref 12.0–15.0)
Lymphocytes Relative: 25 % (ref 12–46)
Lymphs Abs: 1.3 10*3/uL (ref 0.7–4.0)
MCH: 30.9 pg (ref 26.0–34.0)
MCHC: 33 g/dL (ref 30.0–36.0)
MCV: 93.8 fL (ref 78.0–100.0)
MONOS PCT: 6 % (ref 3–12)
Monocytes Absolute: 0.3 10*3/uL (ref 0.1–1.0)
NEUTROS PCT: 66 % (ref 43–77)
Neutro Abs: 3.4 10*3/uL (ref 1.7–7.7)
PLATELETS: 217 10*3/uL (ref 150–400)
RBC: 4.33 MIL/uL (ref 3.87–5.11)
RDW: 13.4 % (ref 11.5–15.5)
WBC: 5.1 10*3/uL (ref 4.0–10.5)

## 2014-10-04 LAB — LIPASE, BLOOD: Lipase: 26 U/L (ref 22–51)

## 2014-10-04 MED ORDER — PEG 3350/ELECTROLYTES 240 G PO SOLR: 240.0000 mL | ORAL | Status: DC

## 2014-10-04 NOTE — Discharge Instructions (Signed)

## 2014-10-04 NOTE — ED Notes (Signed)
Pt states last normal BM was a week ago, now causing discomfort. States she has used gas-x, eating fruits, alka-seltzer, and a supp laxative. Pt has not attempted a fleets enema or stool softeners.

## 2014-10-04 NOTE — ED Provider Notes (Signed)
CSN: [id number]     Arrival date & time 10/04/14  8366 History   First MD Initiated Contact with Patient 10/04/14 [phone]     Chief Complaint  Patient presents with  . Constipation     (Consider location/radiation/quality/duration/timing/severity/associated sxs/prior Treatment) HPI Comments: Patient presents to the emergency department because she is feeling constipated. Patient reports that she has not had a normal bowel movement for 1 week. She has been trying to eat fruits and drink fruit juices without improvement. She has been feeling progressively more distended and bloated, has tried Alka-Seltzer and Sex without improvement. She has taken a Dulcolax suppository as well. Patient reports that symptoms were worse today, could not go to work. She has felt mild nausea, no vomiting. No rectal bleeding or melana.  Patient is a 54 y.o. female presenting with constipation.  Constipation Associated symptoms: nausea     Past Medical History  Diagnosis Date  . GERD (gastroesophageal reflux disease)   . Hypertension   . Hypercholesterolemia    Past Surgical History  Procedure Laterality Date  . Colonoscopy  07/08/2010    Dr. Rourk:Normal rectum, polyp at the ileocecal valve status post piecemeal snare polypectomy followed by resolution and clipping Remainder of colonic mucosa appeared normal. PATH: tubular adenoma  . Appendectomy    . Colonoscopy N/A 11/14/2013    Procedure: COLONOSCOPY;  Surgeon: Daneil Dolin, MD;  [facility];  Service: Endoscopy;  Laterality: N/A;  8:30   Family History  Problem Relation Age of Onset  . Colon cancer Neg Hx    History  Substance Use Topics  . Smoking status: Current Every Day Smoker -- 0.50 packs/day for 30 years    Types: Cigarettes  . Smokeless tobacco: Not on file  . Alcohol Use: Yes     Comment: a drink of brandy 2-3 times per week   OB History    No data available     Review of Systems  Gastrointestinal: Positive for nausea,  constipation and abdominal distention.  All other systems reviewed and are negative.     Allergies  Review of patient's allergies indicates no known allergies.  Home Medications   Prior to Admission medications   Medication Sig Start Date End Date Taking? Authorizing Provider  amLODipine-benazepril (LOTREL) 10-40 MG per capsule Take 1 capsule by mouth daily.      Historical Provider, MD  aspirin 81 MG tablet Take 81 mg by mouth daily.      Historical Provider, MD  FLUoxetine (PROZAC) 20 MG capsule Take 20 mg by mouth daily.      Historical Provider, MD  hydrocortisone (ANUSOL-HC) 2.5 % rectal cream Place 1 application rectally 2 (two) times daily. 10/31/13   Orvil Feil, NP  ibuprofen (ADVIL,MOTRIN) 200 MG tablet Take 200 mg by mouth every 6 (six) hours as needed. Just as needed     Historical Provider, MD  Linaclotide Rolan Lipa) 145 MCG CAPS capsule Take 1 capsule (145 mcg total) by mouth daily. 30 minutes before breakfast 10/31/13   Orvil Feil, NP  losartan (COZAAR) 100 MG tablet Take 100 mg by mouth daily.      Historical Provider, MD  metoprolol tartrate (LOPRESSOR) 25 MG tablet Take 25 mg by mouth daily.      Historical Provider, MD  omeprazole (PRILOSEC) 20 MG capsule Take 20 mg by mouth daily.      Historical Provider, MD  pravastatin (PRAVACHOL) 20 MG tablet Take 20 mg by mouth daily.  Historical Provider, MD   BP 165/84 mmHg  Pulse 81  Temp(Src) 98 F (36.7 C) (Oral)  Resp 16  Ht 5\' 4"  (1.626 m)  Wt 183 lb (83.008 kg)  BMI 31.40 kg/m2  SpO2 100% Physical Exam  Constitutional: She is oriented to person, place, and time. She appears well-developed and well-nourished. No distress.  HENT:  Head: Normocephalic and atraumatic.  Right Ear: Hearing normal.  Left Ear: Hearing normal.  Nose: Nose normal.  Mouth/Throat: Oropharynx is clear and moist and mucous membranes are normal.  Eyes: Conjunctivae and EOM are normal. Pupils are equal, round, and reactive to light.  Neck:  Normal range of motion. Neck supple.  Cardiovascular: Regular rhythm, S1 normal and S2 normal.  Exam reveals no gallop and no friction rub.   No murmur heard. Pulmonary/Chest: Effort normal and breath sounds normal. No respiratory distress. She exhibits no tenderness.  Abdominal: Soft. Normal appearance and bowel sounds are normal. She exhibits distension. There is no hepatosplenomegaly. There is no tenderness. There is no rebound, no guarding, no tenderness at McBurney's point and negative Murphy's sign. No hernia.  Musculoskeletal: Normal range of motion.  Neurological: She is alert and oriented to person, place, and time. She has normal strength. No cranial nerve deficit or sensory deficit. Coordination normal. GCS eye subscore is 4. GCS verbal subscore is 5. GCS motor subscore is 6.  Skin: Skin is warm, dry and intact. No rash noted. No cyanosis.  Psychiatric: She has a normal mood and affect. Her speech is normal and behavior is normal. Thought content normal.  Nursing note and vitals reviewed.   ED Course  Procedures (including critical care time) Labs Review Labs Reviewed  COMPREHENSIVE METABOLIC PANEL - Abnormal; Notable for the following:    Glucose, Bld 208 (*)    All other components within normal limits  URINALYSIS, ROUTINE W REFLEX MICROSCOPIC (NOT AT [facility]) - Abnormal; Notable for the following:    Specific Gravity, Urine <1.005 (*)    Glucose, UA 250 (*)    All other components within normal limits  CBC WITH DIFFERENTIAL/PLATELET  LIPASE, BLOOD    Imaging Review Dg Abd Acute W/chest  10/04/2014   CLINICAL DATA:  Constipation for 1 week.  EXAM: DG ABDOMEN ACUTE W/ 1V CHEST  COMPARISON:  January 17, 2004.  FINDINGS: There is no evidence of dilated bowel loops or free intraperitoneal air. Moderate stool burden is in the right and transverse colon. Possible right renal calculus is noted. Heart size and mediastinal contours are within normal limits. Both lungs are clear.   IMPRESSION: Moderate stool burden is noted in the right and transverse colon suggesting constipation. Possible right renal calculus. No acute cardiopulmonary disease.   Electronically Signed   By: Marijo Conception, M.D.   On: 10/04/2014 08:11     EKG Interpretation None      MDM   Final diagnoses:  Constipation    Patient presents to the ER for evaluation of one week of progressively worsening constipation symptoms. Patient has distention of her abdomen, bowel sounds are present and normal. X-ray shows moderate stool burden in the right colon and transverse colon. She would not benefit from enema at this time. Patient will be treated with bowel prep, initiate increased fiber intake. Follow-up with PCP.    Orpah Greek, MD 10/04/14 [phone]

## 2014-10-14 ENCOUNTER — Other Ambulatory Visit (HOSPITAL_COMMUNITY): Payer: Self-pay | Admitting: Nurse Practitioner

## 2014-10-14 DIAGNOSIS — Z1231 Encounter for screening mammogram for malignant neoplasm of breast: Secondary | ICD-10-CM

## 2014-10-24 ENCOUNTER — Ambulatory Visit (HOSPITAL_COMMUNITY)
Admission: RE | Admit: 2014-10-24 | Discharge: 2014-10-24 | Disposition: A | Discharge status: Home / Self Care | Payer: BLUE CROSS/BLUE SHIELD | Source: Ambulatory Visit | Attending: Nurse Practitioner | Admitting: Nurse Practitioner

## 2014-10-24 DIAGNOSIS — Z1231 Encounter for screening mammogram for malignant neoplasm of breast: Secondary | ICD-10-CM | POA: Insufficient documentation

## 2014-10-25 ENCOUNTER — Other Ambulatory Visit: Payer: Self-pay | Admitting: Nurse Practitioner

## 2014-10-25 DIAGNOSIS — R928 Other abnormal and inconclusive findings on diagnostic imaging of breast: Secondary | ICD-10-CM

## 2014-11-12 ENCOUNTER — Ambulatory Visit (HOSPITAL_COMMUNITY)
Admission: RE | Admit: 2014-11-12 | Discharge: 2014-11-12 | Disposition: A | Discharge status: Home / Self Care | Payer: BLUE CROSS/BLUE SHIELD | Source: Ambulatory Visit | Attending: Nurse Practitioner | Admitting: Nurse Practitioner

## 2014-11-12 DIAGNOSIS — R928 Other abnormal and inconclusive findings on diagnostic imaging of breast: Secondary | ICD-10-CM | POA: Diagnosis not present

## 2015-02-19 ENCOUNTER — Encounter (HOSPITAL_COMMUNITY): Payer: Self-pay

## 2015-02-19 ENCOUNTER — Emergency Department (HOSPITAL_COMMUNITY)
Admission: EM | Admit: 2015-02-19 | Discharge: 2015-02-19 | Disposition: A | Discharge status: Home / Self Care | Payer: BLUE CROSS/BLUE SHIELD | Attending: Emergency Medicine | Admitting: Emergency Medicine

## 2015-02-19 DIAGNOSIS — R0981 Nasal congestion: Secondary | ICD-10-CM | POA: Insufficient documentation

## 2015-02-19 DIAGNOSIS — H5712 Ocular pain, left eye: Secondary | ICD-10-CM | POA: Diagnosis present

## 2015-02-19 DIAGNOSIS — F17210 Nicotine dependence, cigarettes, uncomplicated: Secondary | ICD-10-CM | POA: Insufficient documentation

## 2015-02-19 DIAGNOSIS — Z79899 Other long term (current) drug therapy: Secondary | ICD-10-CM | POA: Insufficient documentation

## 2015-02-19 DIAGNOSIS — H109 Unspecified conjunctivitis: Secondary | ICD-10-CM | POA: Insufficient documentation

## 2015-02-19 DIAGNOSIS — H9209 Otalgia, unspecified ear: Secondary | ICD-10-CM | POA: Diagnosis not present

## 2015-02-19 DIAGNOSIS — Z7982 Long term (current) use of aspirin: Secondary | ICD-10-CM | POA: Diagnosis not present

## 2015-02-19 DIAGNOSIS — Z7952 Long term (current) use of systemic steroids: Secondary | ICD-10-CM | POA: Diagnosis not present

## 2015-02-19 DIAGNOSIS — E7800 Pure hypercholesterolemia, unspecified: Secondary | ICD-10-CM | POA: Insufficient documentation

## 2015-02-19 DIAGNOSIS — I10 Essential (primary) hypertension: Secondary | ICD-10-CM | POA: Insufficient documentation

## 2015-02-19 DIAGNOSIS — K219 Gastro-esophageal reflux disease without esophagitis: Secondary | ICD-10-CM | POA: Diagnosis not present

## 2015-02-19 MED ORDER — TOBRAMYCIN 0.3 % OP SOLN
2.0000 [drp] | Freq: Once | OPHTHALMIC | Status: AC
  Administered 2015-02-19: 2 [drp] via OPHTHALMIC
  Filled 2015-02-19: qty 5

## 2015-02-19 MED ORDER — IBUPROFEN 800 MG PO TABS
800.0000 mg | ORAL_TABLET | Freq: Once | ORAL | Status: AC
  Administered 2015-02-19: 800 mg via ORAL
  Filled 2015-02-19: qty 1

## 2015-02-19 MED ORDER — HYDROCODONE-ACETAMINOPHEN 5-325 MG PO TABS: 1.0000 | ORAL_TABLET | ORAL | Status: DC | PRN

## 2015-02-19 NOTE — ED Provider Notes (Signed)
CSN: [id number]     Arrival date & time 02/19/15  1202 History   First MD Initiated Contact with Patient 02/19/15 1257     Chief Complaint  Patient presents with  . Eye Pain     (Consider location/radiation/quality/duration/timing/severity/associated sxs/prior Treatment) Patient is a 54 y.o. female presenting with eye pain. The history is provided by the patient.  Eye Pain This is a new problem. The current episode started in the past 7 days. The problem occurs intermittently. The problem has been gradually worsening. Associated symptoms include congestion. Pertinent negatives include no chills, fever, headaches, rash or visual change. Exacerbated by: bright light. position of head. Treatments tried: cool compress and warm compress. The treatment provided mild relief.    Past Medical History  Diagnosis Date  . GERD (gastroesophageal reflux disease)   . Hypertension   . Hypercholesterolemia    Past Surgical History  Procedure Laterality Date  . Colonoscopy  07/08/2010    Dr. Rourk:Normal rectum, polyp at the ileocecal valve status post piecemeal snare polypectomy followed by resolution and clipping Remainder of colonic mucosa appeared normal. PATH: tubular adenoma  . Appendectomy    . Colonoscopy N/A 11/14/2013    Procedure: COLONOSCOPY;  Surgeon: Daneil Dolin, MD;  Location: [facility];  Service: Endoscopy;  Laterality: N/A;  8:30   Family History  Problem Relation Age of Onset  . Colon cancer Neg Hx    Social History  Substance Use Topics  . Smoking status: Current Every Day Smoker -- 0.50 packs/day for 30 years    Types: Cigarettes  . Smokeless tobacco: None  . Alcohol Use: Yes     Comment: a drink of brandy 2-3 times per week   OB History    No data available     Review of Systems  Constitutional: Negative for fever and chills.  HENT: Positive for congestion and ear pain.   Eyes: Positive for pain.  Skin: Negative for rash.  Neurological: Negative for  headaches.  All other systems reviewed and are negative.     Allergies  Review of patient's allergies indicates no known allergies.  Home Medications   Prior to Admission medications   Medication Sig Start Date End Date Taking? Authorizing Provider  amLODipine-benazepril (LOTREL) 10-40 MG per capsule Take 1 capsule by mouth daily.      Historical Provider, MD  aspirin 81 MG tablet Take 81 mg by mouth daily.      Historical Provider, MD  FLUoxetine (PROZAC) 20 MG capsule Take 20 mg by mouth daily.      Historical Provider, MD  hydrocortisone (ANUSOL-HC) 2.5 % rectal cream Place 1 application rectally 2 (two) times daily. 10/31/13   Orvil Feil, NP  ibuprofen (ADVIL,MOTRIN) 200 MG tablet Take 200 mg by mouth every 6 (six) hours as needed. Just as needed     Historical Provider, MD  Linaclotide Rolan Lipa) 145 MCG CAPS capsule Take 1 capsule (145 mcg total) by mouth daily. 30 minutes before breakfast 10/31/13   Orvil Feil, NP  losartan (COZAAR) 100 MG tablet Take 100 mg by mouth daily.      Historical Provider, MD  metoprolol tartrate (LOPRESSOR) 25 MG tablet Take 25 mg by mouth daily.      Historical Provider, MD  omeprazole (PRILOSEC) 20 MG capsule Take 20 mg by mouth daily.      Historical Provider, MD  PEG 3350-KCl-NaBcb-NaCl-NaSulf (PEG 3350/ELECTROLYTES) 240 G SOLR Take 240 mLs by mouth every 2 (two) hours. Until bowel  movement, then stop 10/04/14   Orpah Greek, MD  pravastatin (PRAVACHOL) 20 MG tablet Take 20 mg by mouth daily.      Historical Provider, MD   BP 166/77 mmHg  Pulse 92  Temp(Src) 97.9 F (36.6 C) (Oral)  Resp 18  Ht 5\' 5"  (1.651 m)  Wt 81.647 kg  BMI 29.95 kg/m2  SpO2 100% Physical Exam  Constitutional: She is oriented to person, place, and time. She appears well-developed and well-nourished.  Non-toxic appearance.  HENT:  Head: Normocephalic.  Right Ear: Tympanic membrane and external ear normal.  Left Ear: Tympanic membrane and external ear normal.   Eyes: EOM and lids are normal. Pupils are equal, round, and reactive to light. Right eye exhibits no hordeolum. No foreign body present in the right eye. Left eye exhibits no hordeolum. No foreign body present in the left eye. Right conjunctiva is not injected. Left conjunctiva is injected. No scleral icterus. Left eye exhibits normal extraocular motion.  Fundoscopic exam:      The right eye shows no exudate, no hemorrhage and no papilledema.       The left eye shows no exudate, no hemorrhage and no papilledema.  Neck: Normal range of motion. Neck supple. Carotid bruit is not present.  Cardiovascular: Normal rate, regular rhythm, normal heart sounds, intact distal pulses and normal pulses.   Pulmonary/Chest: Breath sounds normal. No respiratory distress.  Abdominal: Soft. Bowel sounds are normal. There is no tenderness. There is no guarding.  Musculoskeletal: Normal range of motion.  Lymphadenopathy:       Head (right side): No submandibular adenopathy present.       Head (left side): No submandibular adenopathy present.    She has no cervical adenopathy.  Neurological: She is alert and oriented to person, place, and time. She has normal strength. No cranial nerve deficit or sensory deficit.  Skin: Skin is warm and dry.  Psychiatric: She has a normal mood and affect. Her speech is normal.  Nursing note and vitals reviewed.   ED Course  Procedures (including critical care time) Labs Review Labs Reviewed - No data to display  Imaging Review No results found. I have personally reviewed and evaluated these images and lab results as part of my medical decision-making.   EKG Interpretation None      MDM  The examination favors conjunctivitis. The patient is advised on the contagious nature of this problem. Patient given tobramycin ophthalmic drops. Patient advised on use of cool compresses. Patient is given prescription for Norco for pain if needed. Patient will also obtain dark  glasses, and use a hat with brim to protect against-like changes. Patient is to see the ophthalmology specialist if not improving.    Final diagnoses:  Conjunctivitis of left eye    *I have reviewed nursing notes, vital signs, and all appropriate lab and imaging results for this patient.**    Lily Kocher, PA-C 02/19/15 1955  Davonna Belling, MD 02/22/15 0001

## 2015-02-19 NOTE — ED Notes (Signed)
Pt reports pain, redness, and watery left eye since Sunday.  Pt says Sunday the symptoms went away until this morning.  Reports is sensitive to light.  Denies visual changes.

## 2015-02-19 NOTE — Discharge Instructions (Signed)
Your examination favors conjunctivitis (pink eye). This is contagious. Please wash hands frequently. Please change your pillowcase daily. Please down surfaces daily. Apply cool compresses to ear eyes 3-4 times daily. Use dark glasses and a hat with brim to prevent pain from bright lights. Use Tylenol or ibuprofen for headache or mild pain, use Norco for more severe pain. Apply 2 drops of tobramycin to the left eye every 4 hours for the next 5 days. Please see your primary physician for follow-up and recheck.

## 2015-11-26 ENCOUNTER — Other Ambulatory Visit (HOSPITAL_COMMUNITY): Payer: Self-pay | Admitting: Nurse Practitioner

## 2015-11-26 DIAGNOSIS — Z1231 Encounter for screening mammogram for malignant neoplasm of breast: Secondary | ICD-10-CM

## 2015-12-03 ENCOUNTER — Ambulatory Visit (HOSPITAL_COMMUNITY)
Admission: RE | Admit: 2015-12-03 | Discharge: 2015-12-03 | Disposition: A | Discharge status: Home / Self Care | Payer: BLUE CROSS/BLUE SHIELD | Source: Ambulatory Visit | Attending: Nurse Practitioner | Admitting: Nurse Practitioner

## 2015-12-03 DIAGNOSIS — Z1231 Encounter for screening mammogram for malignant neoplasm of breast: Secondary | ICD-10-CM | POA: Insufficient documentation

## 2016-10-28 ENCOUNTER — Encounter: Payer: Self-pay | Admitting: Internal Medicine

## 2016-11-11 ENCOUNTER — Other Ambulatory Visit (HOSPITAL_COMMUNITY): Payer: Self-pay | Admitting: Nurse Practitioner

## 2016-11-11 ENCOUNTER — Telehealth: Payer: Self-pay | Admitting: Internal Medicine

## 2016-11-11 DIAGNOSIS — Z1231 Encounter for screening mammogram for malignant neoplasm of breast: Secondary | ICD-10-CM

## 2016-11-11 NOTE — Telephone Encounter (Signed)
[phone]  DUE FOR 3 YR TCS.  [employer] AND NO CURRENT GI ISSUES

## 2016-11-18 NOTE — Telephone Encounter (Signed)
LMOM to call.

## 2016-11-25 NOTE — Telephone Encounter (Signed)
LMOM to call back

## 2016-11-29 NOTE — Telephone Encounter (Signed)
Gastroenterology Pre-Procedure Review  Request Date: Requesting Physician: Angelina Ok  LAST TCS WAS: 11/14/13 RMR  PATIENT REVIEW QUESTIONS: The patient responded to the following health history questions as indicated:    1. Diabetes Melitis: NO 2. Joint replacements in the past 12 months: NO 3. Major health problems in the past 3 months: NO 4. Has an artificial valve or MVP: NO 5. Has a defibrillator: NO 6. Has been advised in past to take antibiotics in advance of a procedure like teeth cleaning: NO 7. Family history of colon cancer: NO 8. Alcohol Use: YES 2-3 SHOTS 9. History of sleep apnea: NO 10. History of coronary artery or other vascular stents placed within the last 12 months: NO 11. History of any prior anesthesia complications: NO    MEDICATIONS & ALLERGIES:    Patient reports the following regarding taking any blood thinners:   Plavix? NO Aspirin? YES Coumadin? NO Brilinta? NO Xarelto? NO Eliquis? NO Pradaxa? NO Savaysa? NO Effient? NO  Patient confirms/reports the following medications:  Current Outpatient Prescriptions  Medication Sig Dispense Refill  . amLODipine-benazepril (LOTREL) 10-40 MG per capsule Take 1 capsule by mouth daily.      Marland Kitchen aspirin 81 MG tablet Take 81 mg by mouth daily.      Marland Kitchen FLUoxetine (PROZAC) 20 MG capsule Take 20 mg by mouth daily.      Marland Kitchen ibuprofen (ADVIL,MOTRIN) 200 MG tablet Take 200 mg by mouth every 6 (six) hours as needed. Just as needed     . losartan (COZAAR) 100 MG tablet Take 100 mg by mouth daily.      . metoprolol tartrate (LOPRESSOR) 25 MG tablet Take 25 mg by mouth daily.      Marland Kitchen omeprazole (PRILOSEC) 20 MG capsule Take 20 mg by mouth daily.      . pravastatin (PRAVACHOL) 20 MG tablet Take 20 mg by mouth daily.       No current facility-administered medications for this visit.     Patient confirms/reports the following allergies:  No Known Allergies  No orders of the defined types were placed in this  encounter.   AUTHORIZATION INFORMATION Primary Insurance: [place],  [place] #: [id number] ,  Group #: [id number] Pre-Cert / Josem Kaufmann required:  Pre-Cert / Auth #:   SCHEDULE INFORMATION: Procedure has been scheduled as follows:  Date: , Time:   Location:   This Gastroenterology Pre-Precedure Review Form is being routed to the following provider(s): R. Garfield Cornea, MD

## 2016-11-29 NOTE — Addendum Note (Signed)
Addended by: Marlou Porch on: 11/29/2016 04:52 PM   Modules accepted: Orders

## 2016-11-29 NOTE — Telephone Encounter (Signed)
Is etoh use daily? And do we still bring patient's in with h/o polyps.

## 2016-11-30 NOTE — Telephone Encounter (Signed)
2-3 shots a week and no we do not bring them in for h/o polyps.

## 2016-11-30 NOTE — Telephone Encounter (Signed)
OK to schedule. Given phenergan 25mg  IV 45 minutes before the procedure in 2015 and did well with conscious sedation. Please plan for same approach.

## 2016-12-01 NOTE — Telephone Encounter (Signed)
LMOM to call back

## 2016-12-06 ENCOUNTER — Other Ambulatory Visit: Payer: Self-pay

## 2016-12-06 ENCOUNTER — Ambulatory Visit (HOSPITAL_COMMUNITY)
Admission: RE | Admit: 2016-12-06 | Discharge: 2016-12-06 | Disposition: A | Discharge status: Home / Self Care | Payer: BLUE CROSS/BLUE SHIELD | Source: Ambulatory Visit | Attending: Nurse Practitioner | Admitting: Nurse Practitioner

## 2016-12-06 DIAGNOSIS — Z1211 Encounter for screening for malignant neoplasm of colon: Secondary | ICD-10-CM

## 2016-12-06 DIAGNOSIS — Z1231 Encounter for screening mammogram for malignant neoplasm of breast: Secondary | ICD-10-CM | POA: Insufficient documentation

## 2016-12-06 MED ORDER — PEG 3350-KCL-NA BICARB-NACL 420 G PO SOLR: 4000.0000 mL | ORAL | 0 refills | Status: DC

## 2016-12-06 NOTE — Telephone Encounter (Signed)
Pt is set up for TCS on 12/16/16 @ 12:00 pm. She is aware and she has instructions

## 2016-12-16 ENCOUNTER — Encounter (HOSPITAL_COMMUNITY)
Admission: RE | Disposition: A | Discharge status: Home / Self Care | Payer: Self-pay | Source: Ambulatory Visit | Attending: Internal Medicine

## 2016-12-16 ENCOUNTER — Encounter (HOSPITAL_COMMUNITY): Payer: Self-pay | Admitting: *Deleted

## 2016-12-16 ENCOUNTER — Ambulatory Visit (HOSPITAL_COMMUNITY)
Admission: RE | Admit: 2016-12-16 | Discharge: 2016-12-16 | Disposition: A | Discharge status: Home / Self Care | Payer: BLUE CROSS/BLUE SHIELD | Source: Ambulatory Visit | Attending: Internal Medicine | Admitting: Internal Medicine

## 2016-12-16 DIAGNOSIS — Z1211 Encounter for screening for malignant neoplasm of colon: Secondary | ICD-10-CM | POA: Diagnosis not present

## 2016-12-16 DIAGNOSIS — Z86010 Personal history of colonic polyps: Secondary | ICD-10-CM | POA: Insufficient documentation

## 2016-12-16 DIAGNOSIS — D122 Benign neoplasm of ascending colon: Secondary | ICD-10-CM | POA: Insufficient documentation

## 2016-12-16 DIAGNOSIS — Z7982 Long term (current) use of aspirin: Secondary | ICD-10-CM | POA: Insufficient documentation

## 2016-12-16 DIAGNOSIS — Z79899 Other long term (current) drug therapy: Secondary | ICD-10-CM | POA: Diagnosis not present

## 2016-12-16 DIAGNOSIS — E7800 Pure hypercholesterolemia, unspecified: Secondary | ICD-10-CM | POA: Diagnosis not present

## 2016-12-16 DIAGNOSIS — K219 Gastro-esophageal reflux disease without esophagitis: Secondary | ICD-10-CM | POA: Diagnosis not present

## 2016-12-16 DIAGNOSIS — F17210 Nicotine dependence, cigarettes, uncomplicated: Secondary | ICD-10-CM | POA: Insufficient documentation

## 2016-12-16 DIAGNOSIS — I10 Essential (primary) hypertension: Secondary | ICD-10-CM | POA: Insufficient documentation

## 2016-12-16 HISTORY — DX: Other seasonal allergic rhinitis: J30.2

## 2016-12-16 HISTORY — PX: POLYPECTOMY: SHX5525

## 2016-12-16 HISTORY — PX: COLONOSCOPY: SHX5424

## 2016-12-16 SURGERY — COLONOSCOPY
Anesthesia: Moderate Sedation

## 2016-12-16 MED ORDER — MEPERIDINE HCL 100 MG/ML IJ SOLN
INTRAMUSCULAR | Status: DC | PRN
  Administered 2016-12-16 (×3): 25 mg
  Administered 2016-12-16: 50 mg

## 2016-12-16 MED ORDER — SODIUM CHLORIDE 0.9 % IV SOLN
INTRAVENOUS | Status: DC
  Administered 2016-12-16: 12:00:00 via INTRAVENOUS

## 2016-12-16 MED ORDER — PROMETHAZINE HCL 25 MG/ML IJ SOLN
INTRAMUSCULAR | Status: AC
  Filled 2016-12-16: qty 1

## 2016-12-16 MED ORDER — LIDOCAINE VISCOUS 2 % MT SOLN
OROMUCOSAL | Status: AC
  Filled 2016-12-16: qty 15

## 2016-12-16 MED ORDER — STERILE WATER FOR IRRIGATION IR SOLN
Status: DC | PRN
  Administered 2016-12-16: 2.5 mL

## 2016-12-16 MED ORDER — MEPERIDINE HCL 100 MG/ML IJ SOLN
INTRAMUSCULAR | Status: AC
  Filled 2016-12-16: qty 2

## 2016-12-16 MED ORDER — PROMETHAZINE HCL 25 MG/ML IJ SOLN
25.0000 mg | Freq: Once | INTRAMUSCULAR | Status: AC
  Administered 2016-12-16: 25 mg via INTRAVENOUS

## 2016-12-16 MED ORDER — MEPERIDINE HCL 50 MG/ML IJ SOLN
INTRAMUSCULAR | Status: AC
  Filled 2016-12-16: qty 1

## 2016-12-16 MED ORDER — MIDAZOLAM HCL 5 MG/5ML IJ SOLN
INTRAMUSCULAR | Status: AC
  Filled 2016-12-16: qty 10

## 2016-12-16 MED ORDER — SODIUM CHLORIDE 0.9% FLUSH
INTRAVENOUS | Status: AC
  Filled 2016-12-16: qty 10

## 2016-12-16 MED ORDER — ONDANSETRON HCL 4 MG/2ML IJ SOLN
INTRAMUSCULAR | Status: DC | PRN
  Administered 2016-12-16: 4 mg via INTRAVENOUS

## 2016-12-16 MED ORDER — ONDANSETRON HCL 4 MG/2ML IJ SOLN
INTRAMUSCULAR | Status: AC
  Filled 2016-12-16: qty 2

## 2016-12-16 MED ORDER — MIDAZOLAM HCL 5 MG/5ML IJ SOLN
INTRAMUSCULAR | Status: DC | PRN
  Administered 2016-12-16: 2 mg via INTRAVENOUS
  Administered 2016-12-16 (×2): 1 mg via INTRAVENOUS
  Administered 2016-12-16: 2 mg via INTRAVENOUS

## 2016-12-16 NOTE — Progress Notes (Signed)
Vital signs remained stable throughout procedure.  Monitored by Dr. Rise Paganini, Lurline Del RN, Selena Lesser RN.  [employer]ips monitoring device failed to pull vital signs to pt. Record due to unsupported land network problem.  Alex from [facility] called and resolved issue with monitor after procedure.

## 2016-12-16 NOTE — Discharge Instructions (Signed)
°Colonoscopy °Discharge Instructions ° °Read the instructions outlined below and refer to this sheet in the next few weeks. These discharge instructions provide you with general information on caring for yourself after you leave the hospital. Your doctor may also give you specific instructions. While your treatment has been planned according to the most current medical practices available, unavoidable complications occasionally occur. If you have any problems or questions after discharge, call Dr. Rourk at [phone]. °ACTIVITY °· You may resume your regular activity, but move at a slower pace for the next 24 hours.  °· Take frequent rest periods for the next 24 hours.  °· Walking will help get rid of the air and reduce the bloated feeling in your belly (abdomen).  °· No driving for 24 hours (because of the medicine (anesthesia) used during the test).   °· Do not sign any important legal documents or operate any machinery for 24 hours (because of the anesthesia used during the test).  °NUTRITION °· Drink plenty of fluids.  °· You may resume your normal diet as instructed by your doctor.  °· Begin with a light meal and progress to your normal diet. Heavy or fried foods are harder to digest and may make you feel sick to your stomach (nauseated).  °· Avoid alcoholic beverages for 24 hours or as instructed.  °MEDICATIONS °· You may resume your normal medications unless your doctor tells you otherwise.  °WHAT YOU CAN EXPECT TODAY °· Some feelings of bloating in the abdomen.  °· Passage of more gas than usual.  °· Spotting of blood in your stool or on the toilet paper.  °IF YOU HAD POLYPS REMOVED DURING THE COLONOSCOPY: °· No aspirin products for 7 days or as instructed.  °· No alcohol for 7 days or as instructed.  °· Eat a soft diet for the next 24 hours.  °FINDING OUT THE RESULTS OF YOUR TEST °Not all test results are available during your visit. If your test results are not back during the visit, make an appointment  with your caregiver to find out the results. Do not assume everything is normal if you have not heard from your caregiver or the medical facility. It is important for you to follow up on all of your test results.  °SEEK IMMEDIATE MEDICAL ATTENTION IF: °· You have more than a spotting of blood in your stool.  °· Your belly is swollen (abdominal distention).  °· You are nauseated or vomiting.  °· You have a temperature over 101.  °· You have abdominal pain or discomfort that is severe or gets worse throughout the day.  ° ° °Colon polyp information provided ° °Further recommendations to follow pending review of pathology report. ° ° °Colon Polyps °Polyps are tissue growths inside the body. Polyps can grow in many places, including the large intestine (colon). A polyp may be a round bump or a mushroom-shaped growth. You could have one polyp or several. °Most colon polyps are noncancerous (benign). However, some colon polyps can become cancerous over time. °What are the causes? °The exact cause of colon polyps is not known. °What increases the risk? °This condition is more likely to develop in people who: °· Have a family history of colon cancer or colon polyps. °· Are older than 50 or older than 45 if they are African American. °· Have inflammatory bowel disease, such as ulcerative colitis or Crohn disease. °· Are overweight. °· Smoke cigarettes. °· Do not get enough exercise. °· Drink too much alcohol. °·   Eat a diet that is: °? High in fat and red meat. °? Low in fiber. °· Had childhood cancer that was treated with abdominal radiation. ° °What are the signs or symptoms? °Most polyps do not cause symptoms. If you have symptoms, they may include: °· Blood coming from your rectum when having a bowel movement. °· Blood in your stool. The stool may look dark red or black. °· A change in bowel habits, such as constipation or diarrhea. ° °How is this diagnosed? °This condition is diagnosed with a colonoscopy. This is a  procedure that uses a lighted, flexible scope to look at the inside of your colon. °How is this treated? °Treatment for this condition involves removing any polyps that are found. Those polyps will then be tested for cancer. If cancer is found, your health care provider will talk to you about options for colon cancer treatment. °Follow these instructions at home: °Diet °· Eat plenty of fiber, such as fruits, vegetables, and whole grains. °· Eat foods that are high in calcium and vitamin D, such as milk, cheese, yogurt, eggs, liver, fish, and broccoli. °· Limit foods high in fat, red meats, and processed meats, such as hot dogs, sausage, bacon, and lunch meats. °· Maintain a healthy weight, or lose weight if recommended by your health care provider. °General instructions °· Do not smoke cigarettes. °· Do not drink alcohol excessively. °· Keep all follow-up visits as told by your health care provider. This is important. This includes keeping regularly scheduled colonoscopies. Talk to your health care provider about when you need a colonoscopy. °· Exercise every day or as told by your health care provider. °Contact a health care provider if: °· You have new or worsening bleeding during a bowel movement. °· You have new or increased blood in your stool. °· You have a change in bowel habits. °· You unexpectedly lose weight. °This information is not intended to replace advice given to you by your health care provider. Make sure you discuss any questions you have with your health care provider. °Document Released: 11/26/2003 Document Revised: 08/07/2015 Document Reviewed: 01/20/2015 °Elsevier Interactive Patient Education © 2018 [employer] ° °

## 2016-12-16 NOTE — Op Note (Signed)
[facility] Patient Name: Jennifer Browning Procedure Date: 12/16/2016 11:22 AM MRN: [id number] Date of Birth: Mar 30, 1960 Attending MD: Norvel Richards , MD CSN: [id number] Age: 56 Admit Type: Outpatient Procedure:                Colonoscopy Indications:              High risk colon cancer surveillance: Personal                            history of colonic polyps Providers:                Norvel Richards, MD, Selena Lesser, Lurline Del, RN, Aram Candela Referring MD:              Medicines:                Midazolam 6 mg IV, Meperidine 125 mg IV,                            Promethazine 25 mg IV, Ondansetron 4 mg IV Complications:            No immediate complications. Estimated Blood Loss:     Estimated blood loss was minimal. Procedure:                Pre-Anesthesia Assessment:                           - Prior to the procedure, a History and Physical                            was performed, and patient medications and                            allergies were reviewed. The patient's tolerance of                            previous anesthesia was also reviewed. The risks                            and benefits of the procedure and the sedation                            options and risks were discussed with the patient.                            All questions were answered, and informed consent                            was obtained. Prior Anticoagulants: The patient has                            taken no previous anticoagulant or antiplatelet  agents. ASA Grade Assessment: II - A patient with                            mild systemic disease. After reviewing the risks                            and benefits, the patient was deemed in                            satisfactory condition to undergo the procedure.                           After obtaining informed consent, the colonoscope                             was passed under direct vision. Throughout the                            procedure, the patient's blood pressure, pulse, and                            oxygen saturations were monitored continuously. The                            [id number]) scope was introduced through                            the anus and advanced to the the cecum, identified                            by appendiceal orifice and ileocecal valve. The                            quality of the bowel preparation was adequate. The                            colonoscopy was performed without difficulty. The                            ileocecal valve, appendiceal orifice, and rectum                            were photographed. Scope In: 12:50:26 PM Scope Out: 1:03:24 PM Scope Withdrawal Time: 0 hours 8 minutes 3 seconds  Total Procedure Duration: 0 hours 12 minutes 58 seconds  Findings:      The perianal and digital rectal examinations were normal.      Four semi-pedunculated polyps were found in the ascending colon. The       polyps were 4 to 6 mm in size. These polyps were removed with a cold       snare. Resection and retrieval were complete. Estimated blood loss was       minimal.      The exam was otherwise without abnormality on direct and retroflexion       views.  Impression:               - Four 4 to 6 mm polyps in the ascending colon,                            removed with a cold snare. Resected and retrieved.                           - The examination was otherwise normal on direct                            and retroflexion views. Moderate Sedation:      Moderate (conscious) sedation was administered by the endoscopy nurse       and supervised by the endoscopist. The following parameters were       monitored: oxygen saturation, heart rate, blood pressure, respiratory       rate, EKG, adequacy of pulmonary ventilation, and response to care.       Total physician intraservice time was 35  minutes. Recommendation:           - Patient has a contact number available for                            emergencies. The signs and symptoms of potential                            delayed complications were discussed with the                            patient. Return to normal activities tomorrow.                            Written discharge instructions were provided to the                            patient.                           - Resume previous diet.                           - Continue present medications.                           - Repeat colonoscopy date to be determined after                            pending pathology results are reviewed for                            surveillance based on pathology results.                           - Return to GI clinic (date not yet determined). Procedure Code(s):        --- Professional ---  [phone], Colonoscopy, flexible; with removal of                            tumor(s), polyp(s), or other lesion(s) by snare                            technique                           99152, Moderate sedation services provided by the                            same physician or other qualified health care                            professional performing the diagnostic or                            therapeutic service that the sedation supports,                            requiring the presence of an independent trained                            observer to assist in the monitoring of the                            patient's level of consciousness and physiological                            status; initial 15 minutes of intraservice time,                            patient age 7 years or older                           [phone], Moderate sedation services; each additional                            15 minutes intraservice time Diagnosis Code(s):        --- Professional ---                           Z86.010, Personal  history of colonic polyps                           D12.2, Benign neoplasm of ascending colon CPT copyright 2016 [employer]. All rights reserved. The codes documented in this report are preliminary and upon coder review may  be revised to meet current compliance requirements. Cristopher Estimable. Maudine Kluesner, MD Norvel Richards, MD 12/16/2016 1:14:20 PM This report has been signed electronically. Number of Addenda: 0

## 2016-12-16 NOTE — H&P (Signed)
@LOGO @   Primary Care Physician:  Renee Rival, NP Primary Gastroenterologist:  Dr. Gala Romney  Pre-Procedure History & Physical: HPI:  Jennifer Browning is a 56 y.o. female here for surveillance colonoscopy day. History of multiple colonic adenomas removed in 2015. No bowel symptoms currently.  Past Medical History:  Diagnosis Date  . GERD (gastroesophageal reflux disease)   . Hypercholesterolemia   . Hypertension   . Seasonal allergies     Past Surgical History:  Procedure Laterality Date  . APPENDECTOMY    . COLONOSCOPY  07/08/2010   Dr. Scarlette Hogston:Normal rectum, polyp at the ileocecal valve status post piecemeal snare polypectomy followed by resolution and clipping Remainder of colonic mucosa appeared normal. PATH: tubular adenoma  . COLONOSCOPY N/A 11/14/2013   Procedure: COLONOSCOPY;  Surgeon: Daneil Dolin, MD;  Location: [facility];  Service: Endoscopy;  Laterality: N/A;  8:30    Prior to Admission medications   Medication Sig Start Date End Date Taking? Authorizing Provider  acetaminophen (TYLENOL) 650 MG CR tablet Take 650 mg by mouth at bedtime.   Yes [provider]  amLODipine-benazepril (LOTREL) 10-40 MG per capsule Take 1 capsule by mouth daily.     Yes [provider]  aspirin EC 81 MG tablet Take 81 mg by mouth daily.   Yes [provider]  atorvastatin (LIPITOR) 40 MG tablet Take 40 mg by mouth daily.   Yes [provider]  azelastine (ASTELIN) 0.1 % nasal spray Place 1-2 sprays into both nostrils daily. Use in each nostril as directed   Yes [provider]  cetirizine (ZYRTEC) 10 MG tablet Take 10 mg by mouth every other day. In the evening   Yes [provider]  [facility] Liver Oil CAPS Take 1 capsule by mouth daily with breakfast.   Yes [provider]  FLUoxetine (PROZAC) 40 MG capsule Take 80 mg by mouth daily with breakfast.   Yes [provider]  fluticasone (FLONASE) 50 MCG/ACT nasal  spray Place 1-2 sprays into both nostrils daily as needed for allergies or rhinitis.   Yes [provider]  ibuprofen (ADVIL,MOTRIN) 200 MG tablet Take 400 mg by mouth daily.    Yes [provider]  metoprolol tartrate (LOPRESSOR) 25 MG tablet Take 25 mg by mouth daily.   Yes [provider]  omeprazole (PRILOSEC) 20 MG capsule Take 20 mg by mouth daily before breakfast.    Yes [provider]  polyethylene glycol-electrolytes (TRILYTE) 420 g solution Take 4,000 mLs by mouth as directed. 12/06/16  Yes Kamaron Deskins, Cristopher Estimable, MD  spironolactone (ALDACTONE) 25 MG tablet Take 25 mg by mouth daily with breakfast.   Yes [provider]  Vitamin D, Ergocalciferol, (DRISDOL) 50000 units CAPS capsule Take 50,000 Units by mouth 2 (two) times a week. Sunday & Thursday   Yes [provider]    Allergies as of 12/06/2016  . (No Known Allergies)    Family History  Problem Relation Age of Onset  . Colon cancer Neg Hx     Social History   Social History  . Marital status: Married    Spouse name: N/A  . Number of children: N/A  . Years of education: N/A   Occupational History  . Not on file.   Social History Main Topics  . Smoking status: Current Every Day Smoker    Packs/day: 1.00    Years: 40.00    Types: Cigarettes  . Smokeless tobacco: Never Used  . Alcohol use  Yes     Comment: a drink of brandy 2-3 times per week  . Drug use: No  . Sexual activity: Not on file   Other Topics Concern  . Not on file   Social History Narrative  . No narrative on file    Review of Systems: See HPI, otherwise negative ROS  Physical Exam: Pulse 64   Temp 98.6 F (37 C) (Oral)   Resp 12   Ht 5\' 5"  (1.651 m)   Wt 182 lb (82.6 kg)   SpO2 100%   BMI 30.29 kg/m  General:   Alert,  Well-developed, well-nourished, pleasant and cooperative in NAD Neck:  Supple; no masses or thyromegaly. No significant cervical adenopathy. Lungs:  Clear throughout to  auscultation.   No wheezes, crackles, or rhonchi. No acute distress. Heart:  Regular rate and rhythm; no murmurs, clicks, rubs,  or gallops. Abdomen: Non-distended, normal bowel sounds.  Soft and nontender without appreciable mass or hepatosplenomegaly.  Pulses:  Normal pulses noted. Extremities:  Without clubbing or edema.  Impression:  Pleasant 56 year old lady with history of multiple colonic adenomas removed 2015; due for surveillance examination examination today   Recommendations:  I have offered the patient a surveillance colonoscopy today per plan.  The risks, benefits, limitations, alternatives and imponderables have been reviewed with the patient. Questions have been answered. All parties are agreeable.   \     Notice: This dictation was prepared with Dragon dictation along with smaller phrase technology. Any transcriptional errors that result from this process are unintentional and may not be corrected upon review.

## 2016-12-20 ENCOUNTER — Encounter (HOSPITAL_COMMUNITY): Payer: Self-pay | Admitting: Internal Medicine

## 2016-12-26 ENCOUNTER — Encounter: Payer: Self-pay | Admitting: Internal Medicine

## 2017-11-08 ENCOUNTER — Other Ambulatory Visit: Payer: Self-pay

## 2017-11-08 ENCOUNTER — Encounter (HOSPITAL_COMMUNITY): Payer: Self-pay | Admitting: Emergency Medicine

## 2017-11-08 ENCOUNTER — Emergency Department (HOSPITAL_COMMUNITY): Payer: BLUE CROSS/BLUE SHIELD

## 2017-11-08 ENCOUNTER — Observation Stay (HOSPITAL_COMMUNITY)
Admission: EM | Admit: 2017-11-08 | Discharge: 2017-11-09 | Disposition: A | Discharge status: Home / Self Care | Payer: BLUE CROSS/BLUE SHIELD | Attending: Internal Medicine | Admitting: Internal Medicine

## 2017-11-08 DIAGNOSIS — Z79899 Other long term (current) drug therapy: Secondary | ICD-10-CM | POA: Diagnosis not present

## 2017-11-08 DIAGNOSIS — F17210 Nicotine dependence, cigarettes, uncomplicated: Secondary | ICD-10-CM | POA: Insufficient documentation

## 2017-11-08 DIAGNOSIS — R531 Weakness: Secondary | ICD-10-CM

## 2017-11-08 DIAGNOSIS — I10 Essential (primary) hypertension: Secondary | ICD-10-CM | POA: Diagnosis present

## 2017-11-08 DIAGNOSIS — Z7982 Long term (current) use of aspirin: Secondary | ICD-10-CM | POA: Diagnosis not present

## 2017-11-08 DIAGNOSIS — I169 Hypertensive crisis, unspecified: Secondary | ICD-10-CM

## 2017-11-08 DIAGNOSIS — I161 Hypertensive emergency: Principal | ICD-10-CM | POA: Insufficient documentation

## 2017-11-08 DIAGNOSIS — R200 Anesthesia of skin: Secondary | ICD-10-CM | POA: Diagnosis present

## 2017-11-08 LAB — CBC
HCT: 36.8 % (ref 36.0–46.0)
HCT: 36.9 % (ref 36.0–46.0)
HEMOGLOBIN: 12.2 g/dL (ref 12.0–15.0)
Hemoglobin: 12.3 g/dL (ref 12.0–15.0)
MCH: 31.5 pg (ref 26.0–34.0)
MCH: 31.1 pg (ref 26.0–34.0)
MCHC: 33.4 g/dL (ref 30.0–36.0)
MCHC: 33.1 g/dL (ref 30.0–36.0)
MCV: 94.4 fL (ref 78.0–100.0)
MCV: 94.1 fL (ref 78.0–100.0)
PLATELETS: 231 10*3/uL (ref 150–400)
Platelets: 233 10*3/uL (ref 150–400)
RBC: 3.9 MIL/uL (ref 3.87–5.11)
RBC: 3.92 MIL/uL (ref 3.87–5.11)
RDW: 12.3 % (ref 11.5–15.5)
RDW: 12.4 % (ref 11.5–15.5)
WBC: 4.5 10*3/uL (ref 4.0–10.5)
WBC: 5 10*3/uL (ref 4.0–10.5)

## 2017-11-08 LAB — I-STAT CHEM 8, ED
BUN: 20 mg/dL (ref 6–20)
CALCIUM ION: 1.21 mmol/L (ref 1.15–1.40)
CHLORIDE: 109 mmol/L (ref 98–111)
Creatinine, Ser: 0.9 mg/dL (ref 0.44–1.00)
Glucose, Bld: 84 mg/dL (ref 70–99)
HCT: 37 % (ref 36.0–46.0)
HEMOGLOBIN: 12.6 g/dL (ref 12.0–15.0)
Potassium: 3.5 mmol/L (ref 3.5–5.1)
SODIUM: 141 mmol/L (ref 135–145)
TCO2: 21 mmol/L — ABNORMAL LOW (ref 22–32)

## 2017-11-08 LAB — DIFFERENTIAL
BASOS PCT: 0 %
Basophils Absolute: 0 10*3/uL (ref 0.0–0.1)
EOS PCT: 1 %
Eosinophils Absolute: 0 10*3/uL (ref 0.0–0.7)
Lymphocytes Relative: 34 %
Lymphs Abs: 1.6 10*3/uL (ref 0.7–4.0)
MONO ABS: 0.4 10*3/uL (ref 0.1–1.0)
Monocytes Relative: 9 %
Neutro Abs: 2.5 10*3/uL (ref 1.7–7.7)
Neutrophils Relative %: 56 %

## 2017-11-08 LAB — COMPREHENSIVE METABOLIC PANEL
ALT: 29 U/L (ref 0–44)
ANION GAP: 8 (ref 5–15)
AST: 29 U/L (ref 15–41)
Albumin: 3.9 g/dL (ref 3.5–5.0)
Alkaline Phosphatase: 81 U/L (ref 38–126)
BUN: 19 mg/dL (ref 6–20)
CHLORIDE: 109 mmol/L (ref 98–111)
CO2: 23 mmol/L (ref 22–32)
Calcium: 9 mg/dL (ref 8.9–10.3)
Creatinine, Ser: 0.93 mg/dL (ref 0.44–1.00)
Glucose, Bld: 87 mg/dL (ref 70–99)
Potassium: 3.7 mmol/L (ref 3.5–5.1)
SODIUM: 140 mmol/L (ref 135–145)
Total Bilirubin: 0.8 mg/dL (ref 0.3–1.2)
Total Protein: 7.3 g/dL (ref 6.5–8.1)

## 2017-11-08 LAB — RAPID URINE DRUG SCREEN, HOSP PERFORMED
AMPHETAMINES: NOT DETECTED
BENZODIAZEPINES: NOT DETECTED
Barbiturates: NOT DETECTED
Cocaine: NOT DETECTED
Opiates: NOT DETECTED
Tetrahydrocannabinol: NOT DETECTED

## 2017-11-08 LAB — URINALYSIS, ROUTINE W REFLEX MICROSCOPIC
BILIRUBIN URINE: NEGATIVE
Glucose, UA: NEGATIVE mg/dL
HGB URINE DIPSTICK: NEGATIVE
Ketones, ur: NEGATIVE mg/dL
Leukocytes, UA: NEGATIVE
NITRITE: NEGATIVE
PH: 6 (ref 5.0–8.0)
Protein, ur: NEGATIVE mg/dL
SPECIFIC GRAVITY, URINE: 1.011 (ref 1.005–1.030)

## 2017-11-08 LAB — I-STAT BETA HCG BLOOD, ED (MC, WL, AP ONLY)

## 2017-11-08 LAB — PROTIME-INR
INR: 0.96
PROTHROMBIN TIME: 12.7 s (ref 11.4–15.2)

## 2017-11-08 LAB — CREATININE, SERUM
CREATININE: 0.85 mg/dL (ref 0.44–1.00)
GFR calc Af Amer: >60 mL/min (ref >60)
GFR calc non Af Amer: >60 mL/min (ref >60)

## 2017-11-08 LAB — APTT: aPTT: 25 seconds (ref 24–36)

## 2017-11-08 LAB — I-STAT TROPONIN, ED: TROPONIN I, POC: 0 ng/mL (ref 0.00–0.08)

## 2017-11-08 LAB — TSH: TSH: 1.589 u[IU]/mL (ref 0.350–4.500)

## 2017-11-08 LAB — ETHANOL

## 2017-11-08 LAB — VITAMIN B12: VITAMIN B 12: 402 pg/mL (ref 180–914)

## 2017-11-08 MED ORDER — METOPROLOL TARTRATE 5 MG/5ML IV SOLN
5.0000 mg | Freq: Once | INTRAVENOUS | Status: AC
  Administered 2017-11-08: 5 mg via INTRAVENOUS
  Filled 2017-11-08: qty 5

## 2017-11-08 MED ORDER — FLUOXETINE HCL 20 MG PO CAPS
80.0000 mg | ORAL_CAPSULE | Freq: Every day | ORAL | Status: DC
  Administered 2017-11-09: 80 mg via ORAL
  Filled 2017-11-08 (×2): qty 4

## 2017-11-08 MED ORDER — ACETAMINOPHEN 650 MG RE SUPP: 650.0000 mg | Freq: Four times a day (QID) | RECTAL | Status: DC | PRN

## 2017-11-08 MED ORDER — METOPROLOL SUCCINATE ER 50 MG PO TB24
50.0000 mg | ORAL_TABLET | Freq: Every day | ORAL | Status: DC
  Administered 2017-11-09: 50 mg via ORAL
  Filled 2017-11-08: qty 1

## 2017-11-08 MED ORDER — SODIUM CHLORIDE 0.9 % IV SOLN: 250.0000 mL | INTRAVENOUS | Status: DC | PRN

## 2017-11-08 MED ORDER — PANTOPRAZOLE SODIUM 40 MG PO TBEC
80.0000 mg | DELAYED_RELEASE_TABLET | Freq: Every day | ORAL | Status: DC
  Administered 2017-11-09: 80 mg via ORAL
  Filled 2017-11-08: qty 2

## 2017-11-08 MED ORDER — ATORVASTATIN CALCIUM 40 MG PO TABS
40.0000 mg | ORAL_TABLET | Freq: Every day | ORAL | Status: DC
  Filled 2017-11-08: qty 1

## 2017-11-08 MED ORDER — ASPIRIN EC 81 MG PO TBEC
81.0000 mg | DELAYED_RELEASE_TABLET | Freq: Every day | ORAL | Status: DC
  Administered 2017-11-09: 81 mg via ORAL
  Filled 2017-11-08: qty 1

## 2017-11-08 MED ORDER — ACETAMINOPHEN 325 MG PO TABS
650.0000 mg | ORAL_TABLET | Freq: Four times a day (QID) | ORAL | Status: DC | PRN
  Administered 2017-11-09: 650 mg via ORAL
  Filled 2017-11-08: qty 2

## 2017-11-08 MED ORDER — HYDRALAZINE HCL 20 MG/ML IJ SOLN
5.0000 mg | Freq: Once | INTRAMUSCULAR | Status: AC
  Administered 2017-11-08: 5 mg via INTRAVENOUS
  Filled 2017-11-08: qty 1

## 2017-11-08 MED ORDER — SPIRONOLACTONE 25 MG PO TABS
25.0000 mg | ORAL_TABLET | Freq: Every day | ORAL | Status: DC
  Administered 2017-11-09: 25 mg via ORAL
  Filled 2017-11-08: qty 1

## 2017-11-08 MED ORDER — ENOXAPARIN SODIUM 40 MG/0.4ML ~~LOC~~ SOLN
40.0000 mg | SUBCUTANEOUS | Status: DC
  Administered 2017-11-08: 40 mg via SUBCUTANEOUS
  Filled 2017-11-08: qty 0.4

## 2017-11-08 MED ORDER — SODIUM CHLORIDE 0.9% FLUSH
3.0000 mL | Freq: Two times a day (BID) | INTRAVENOUS | Status: DC
  Administered 2017-11-08: 3 mL via INTRAVENOUS

## 2017-11-08 MED ORDER — SENNOSIDES-DOCUSATE SODIUM 8.6-50 MG PO TABS: 1.0000 | ORAL_TABLET | Freq: Every evening | ORAL | Status: DC | PRN

## 2017-11-08 MED ORDER — SODIUM CHLORIDE 0.9% FLUSH: 3.0000 mL | INTRAVENOUS | Status: DC | PRN

## 2017-11-08 MED ORDER — ONDANSETRON HCL 4 MG/2ML IJ SOLN: 4.0000 mg | Freq: Four times a day (QID) | INTRAMUSCULAR | Status: DC | PRN

## 2017-11-08 MED ORDER — ONDANSETRON HCL 4 MG PO TABS: 4.0000 mg | ORAL_TABLET | Freq: Four times a day (QID) | ORAL | Status: DC | PRN

## 2017-11-08 NOTE — ED Triage Notes (Signed)
Pt c/o left sided numbness to face and arm x 1 week but worse today. Denies trouble swallowing/speaking. Pt did have more dullness to left side of face/arm and leg noted. Pt ambulatory steady. Primary rn in room

## 2017-11-08 NOTE — H&P (Signed)
History and Physical    Jennifer Browning EXH:[id number] DOB: 1960/12/12 DOA: 11/08/2017  Referring MD/NP/PA: Merrily Pew, EDP PCP: Renee Rival, NP  Patient coming from: Home  Chief Complaint: High blood pressure, left-sided numbness  HPI: Jennifer Browning is a 57 y.o. female with history of hypertension and hyperlipidemia as well as GERD who states that for the past 2 to 3 weeks she has noted blood pressures at home to be in the 1 38-101 range systolic.  She states that 3 weeks ago she was taken off the benazepril that she had been on for years as she was noticed to have angioedema.  For the past 2 days she has noted some left-sided face and arm numbness and decided to come to the hospital today for evaluation.  In the ED she is found to be hypertensive with a systolic blood pressure between 180 and 190, otherwise vital signs are stable, labs are unremarkable, chest x-ray shows no acute disease, MRI of the brain shows no acute or subacute infarct, there are scattered subcortical and brainstem T2 signal hyperintensities that are advanced for age, the finding is nonspecific but can be seen with chronic microvascular ischemia or demyelinating process.  EDP as discussed case with Dr. Merlene Laughter, he believes most likely etiology is severe hypertension and admission has been requested.  Past Medical/Surgical History: Past Medical History:  Diagnosis Date  . GERD (gastroesophageal reflux disease)   . Hypercholesterolemia   . Hypertension   . Seasonal allergies     Past Surgical History:  Procedure Laterality Date  . APPENDECTOMY    . COLONOSCOPY  07/08/2010   Dr. Rourk:Normal rectum, polyp at the ileocecal valve status post piecemeal snare polypectomy followed by resolution and clipping Remainder of colonic mucosa appeared normal. PATH: tubular adenoma  . COLONOSCOPY N/A 11/14/2013   Procedure: COLONOSCOPY;  Surgeon: Daneil Dolin, MD;  Location: [facility];  Service: Endoscopy;   Laterality: N/A;  8:30  . COLONOSCOPY N/A 12/16/2016   Procedure: COLONOSCOPY;  Surgeon: Daneil Dolin, MD;  Location: [facility];  Service: Endoscopy;  Laterality: N/A;  1200  . POLYPECTOMY  12/16/2016   Procedure: POLYPECTOMY-ASCENDING COLON;  Surgeon: Daneil Dolin, MD;  Location: [facility];  Service: Endoscopy;;    Social History:  reports that she has been smoking cigarettes. She has a 40.00 pack-year smoking history. She has never used smokeless tobacco. She reports that she drinks alcohol. She reports that she does not use drugs.  Allergies: Allergies  Allergen Reactions  . Benazepril     Family History:  Family History  Problem Relation Age of Onset  . Colon cancer Neg Hx     Prior to Admission medications   Medication Sig Start Date End Date Taking? Authorizing Provider  acetaminophen (TYLENOL) 650 MG CR tablet Take 650 mg by mouth at bedtime.   Yes [provider]  aspirin EC 81 MG tablet Take 81 mg by mouth daily.   Yes [provider]  atorvastatin (LIPITOR) 40 MG tablet Take 40 mg by mouth daily.   Yes [provider]  azelastine (ASTELIN) 0.1 % nasal spray Place 1-2 sprays into both nostrils daily. Use in each nostril as directed   Yes [provider]  BIOTIN PO Take 1 tablet by mouth daily.   Yes [provider]  cetirizine (ZYRTEC) 10 MG tablet Take 10 mg by mouth every other day. In the evening   Yes [provider]  [facility] Liver  Oil CAPS Take 1 capsule by mouth daily with breakfast.   Yes [provider]  diphenhydrAMINE (BENADRYL) 25 MG tablet Take 25 mg by mouth every 6 (six) hours as needed.   Yes [provider]  FLUoxetine (PROZAC) 40 MG capsule Take 80 mg by mouth daily with breakfast.   Yes [provider]  fluticasone (FLONASE) 50 MCG/ACT nasal spray Place 1-2 sprays into both nostrils daily as needed for allergies or rhinitis.   Yes [provider]  ibuprofen  (ADVIL,MOTRIN) 200 MG tablet Take 400 mg by mouth daily.    Yes [provider]  metoprolol tartrate (LOPRESSOR) 25 MG tablet Take 25 mg by mouth daily.   Yes [provider]  omeprazole (PRILOSEC) 20 MG capsule Take 20 mg by mouth daily before breakfast.    Yes [provider]  spironolactone (ALDACTONE) 25 MG tablet Take 25 mg by mouth daily with breakfast.   Yes [provider]  Vitamin D, Ergocalciferol, (DRISDOL) 50000 units CAPS capsule Take 50,000 Units by mouth 2 (two) times a week. Sunday & Thursday   Yes [provider]  polyethylene glycol-electrolytes (TRILYTE) 420 g solution Take 4,000 mLs by mouth as directed. Patient not taking: Reported on 11/08/2017 12/06/16   Daneil Dolin, MD    Review of Systems:  Constitutional: Denies fever, chills, diaphoresis, appetite change and fatigue.  HEENT: Denies photophobia, eye pain, redness, hearing loss, ear pain, congestion, sore throat, rhinorrhea, sneezing, mouth sores, trouble swallowing, neck pain, neck stiffness and tinnitus.   Respiratory: Denies SOB, DOE, cough, chest tightness,  and wheezing.   Cardiovascular: Denies chest pain, palpitations and leg swelling.  Gastrointestinal: Denies nausea, vomiting, abdominal pain, diarrhea, constipation, blood in stool and abdominal distention.  Genitourinary: Denies dysuria, urgency, frequency, hematuria, flank pain and difficulty urinating.  Endocrine: Denies: hot or cold intolerance, sweats, changes in hair or nails, polyuria, polydipsia. Musculoskeletal: Denies myalgias, back pain, joint swelling, arthralgias and gait problem.  Skin: Denies pallor, rash and wound.  Neurological: Denies dizziness, seizures, syncope, weakness, light-headedness,and headaches.  Hematological: Denies adenopathy. Easy bruising, personal or family bleeding history  Psychiatric/Behavioral: Denies suicidal ideation, mood changes, confusion, nervousness, sleep disturbance and  agitation    Physical Exam: Vitals:   11/08/17 1400 11/08/17 1430 11/08/17 1637 11/08/17 1638  BP: (!) 165/82 138/80 (!) 159/89   Pulse: 65 71 63   Resp: (!) 26 (!) 26 15   Temp:   97.9 F (36.6 C)   TempSrc:   Oral   SpO2: 100% 100% 99%   Weight:    75 kg  Height:    5\' 7"  (1.702 m)     Constitutional: NAD, calm, comfortable Eyes: PERRL, lids and conjunctivae normal ENMT: Mucous membranes are moist. Posterior pharynx clear of any exudate or lesions.Normal dentition.  Neck: normal, supple, no masses, no thyromegaly Respiratory: clear to auscultation bilaterally, no wheezing, no crackles. Normal respiratory effort. No accessory muscle use.  Cardiovascular: Regular rate and rhythm, no murmurs / rubs / gallops. No extremity edema. 2+ pedal pulses. No carotid bruits.  Abdomen: no tenderness, no masses palpated. No hepatosplenomegaly. Bowel sounds positive.  Musculoskeletal: no clubbing / cyanosis. No joint deformity upper and lower extremities. Good ROM, no contractures. Normal muscle tone.  Skin: no rashes, lesions, ulcers. No induration Neurologic: CN 2-12 grossly intact. Sensation intact, DTR normal. Strength 5/5 in all 4.  Psychiatric: Normal judgment and insight. Alert and oriented x 3. Normal mood.    Labs on Admission: I  have personally reviewed the following labs and imaging studies  CBC: Recent Labs  Lab 11/08/17 1020 11/08/17 1047  WBC 4.5  --   NEUTROABS 2.5  --   HGB 12.3 12.6  HCT 36.8 37.0  MCV 94.4  --   PLT 231  --    Basic Metabolic Panel: Recent Labs  Lab 11/08/17 1020 11/08/17 1047  NA 140 141  K 3.7 3.5  CL 109 109  CO2 23  --   GLUCOSE 87 84  BUN 19 20  CREATININE 0.93 0.90  CALCIUM 9.0  --    GFR: Estimated Creatinine Clearance: 72.1 mL/min (by C-G formula based on SCr of 0.9 mg/dL). Liver Function Tests: Recent Labs  Lab 11/08/17 1020  AST 29  ALT 29  ALKPHOS 81  BILITOT 0.8  PROT 7.3  ALBUMIN 3.9   No results for input(s):  LIPASE, AMYLASE in the last 168 hours. No results for input(s): AMMONIA in the last 168 hours. Coagulation Profile: Recent Labs  Lab 11/08/17 1020  INR 0.96   Cardiac Enzymes: No results for input(s): CKTOTAL, CKMB, CKMBINDEX, TROPONINI in the last 168 hours. BNP (last 3 results) No results for input(s): PROBNP in the last 8760 hours. HbA1C: No results for input(s): HGBA1C in the last 72 hours. CBG: No results for input(s): GLUCAP in the last 168 hours. Lipid Profile: No results for input(s): CHOL, HDL, LDLCALC, TRIG, CHOLHDL, LDLDIRECT in the last 72 hours. Thyroid Function Tests: No results for input(s): TSH, T4TOTAL, FREET4, T3FREE, THYROIDAB in the last 72 hours. Anemia Panel: No results for input(s): VITAMINB12, FOLATE, FERRITIN, TIBC, IRON, RETICCTPCT in the last 72 hours. Urine analysis:    Component Value Date/Time   COLORURINE YELLOW 11/08/2017 [place] 11/08/2017 1032   LABSPEC 1.011 11/08/2017 1032   PHURINE 6.0 11/08/2017 1032   GLUCOSEU NEGATIVE 11/08/2017 1032   HGBUR NEGATIVE 11/08/2017 1032   BILIRUBINUR NEGATIVE 11/08/2017 1032   KETONESUR NEGATIVE 11/08/2017 1032   PROTEINUR NEGATIVE 11/08/2017 1032   UROBILINOGEN 0.2 10/04/2014 0800   NITRITE NEGATIVE 11/08/2017 1032   LEUKOCYTESUR NEGATIVE 11/08/2017 1032   Sepsis Labs: @LABRCNTIP (procalcitonin:4,lacticidven:4) )No results found for this or any previous visit (from the past 240 hour(s)).   Radiological Exams on Admission: Dg Chest 2 View  Result Date: 11/08/2017 CLINICAL DATA:  Elevated blood pressure, numbness on LEFT side this morning, history hypertension EXAM: CHEST - 2 VIEW COMPARISON:  10/02/2014 FINDINGS: Normal heart size, mediastinal contours, and pulmonary vascularity. Lungs clear. No pulmonary infiltrate, pleural effusion or pneumothorax. Bones unremarkable. IMPRESSION: No acute abnormalities. Electronically Signed   By: Lavonia Dana M.D.   On: 11/08/2017 11:11   Mr Brain  Wo Contrast  Result Date: 11/08/2017 CLINICAL DATA:  Left-sided numbness face arm for 1 week. Symptoms worse today. Neuro deficit, greater than 6 hours, stroke suspected. EXAM: MRI HEAD WITHOUT CONTRAST TECHNIQUE: Multiplanar, multiecho pulse sequences of the brain and surrounding structures were obtained without intravenous contrast. COMPARISON:  None. FINDINGS: Brain: The diffusion-weighted images demonstrate no evidence for acute or subacute infarction. Scattered subcortical T2 hyperintensities are mildly advanced for age. Prominent white matter changes are noted in the pons. Cerebellum is unremarkable. No acute hemorrhage or mass lesion is present. Ventricles are of normal size. No significant extra-axial fluid collection is present. Vascular: Flow is present in the major intracranial arteries. Skull and upper cervical spine: Skull base is within normal limits. The upper cervical spine is within normal limits. Marrow signal is normal. Sinuses/Orbits: A  polyp or mucous retention cyst is present at the floor of the right maxillary sinus. The paranasal sinuses and mastoid air cells are clear. Globes and orbits are within limits. IMPRESSION: 1. No acute or subacute infarct. 2. Scattered subcortical and brainstem T2 signal hyperintensities are mildly advanced for age. The finding is nonspecific but can be seen in the setting of chronic microvascular ischemia, a demyelinating process such as multiple sclerosis, vasculitis, complicated migraine headaches, or as the sequelae of a prior infectious or inflammatory process. Electronically Signed   By: San Morelle M.D.   On: 11/08/2017 12:02    EKG: Independently reviewed.  Normal sinus rhythm with no acute ischemic changes  Assessment/Plan Principal Problem:   Left-sided weakness Active Problems:   HTN (hypertension), malignant    Left-sided weakness -MRI is negative for CVA. -After discussion with neurology, most likely etiology is severe  hypertension and admission is requested for BP management. -Patient denies migraine headaches. -Patient is ambulatory at baseline, denotes no gait disturbances, will refrain from PT evaluation. -Check TSH and vitamin B12.   -Continue daily aspirin.  Malignant hypertension -She was taken off long-term benazepril 3 weeks ago due to angioedema and ever since then has noted issues with hypertension. -She is currently on Toprol-XL 25 mg daily and spironolactone 25 mg daily (unclear as to reasoning behind this choice), heart rate is currently in the 70-80 range. -We will increase Toprol-XL to 50 mg daily, please note that this can take 2 to 4 weeks to exert maximal effect so doubt further medication changes while hospitalized and will need to rely on good outpatient monitoring of blood pressure.  Hyperlipidemia -Continue atorvastatin.  GERD -Continue Protonix.  Depression and anxiety -Continue fluoxetine.   DVT prophylaxis: Lovenox Code Status: Full code Family Communication: Multiple family members at bedside updated on plan of care and all questions answered Disposition Plan: Anticipate discharge home over the next 24 to 48 hours Consults called: [place] via phone by EDP Admission status: It is my clinical opinion that referral for OBSERVATION is reasonable and necessary in this patient based on the above information provided. The aforementioned taken together are felt to place the patient at high risk for further clinical deterioration. However it is anticipated that the patient may be medically stable for discharge from the hospital within 24 to 48 hours.      Time Spent: 85 minutes  Estela Isaac Bliss MD [facility] Pager [phone]  If 7PM-7AM, please contact night-coverage [email or web address] Password [facility]  11/08/2017, 5:57 PM

## 2017-11-08 NOTE — Consult Note (Signed)
[place]anger[place] A. Merlene Laughter, MD     [email or web address]          Jennifer Browning is an 57 y.o. female.   ASSESSMENT/PLAN: 1.  Subacute left-sided weakness with concerning MRI findings: The MRI findings statistically most likely due to microvascular changes given the long-standing history of hypertension.  However, the pattern is somewhat atypical for microvascular ischemia and therefore I think additional work-up is warranted.  Repeat MRI with contrast will be obtained.  Additionally, cervical spine MRI with and without contrast will be obtained.  Additional labs will also be done.  The patient is a 57 year old black female who presents with the a 2-week history of episodic weakness of the left side.  This is associated with numbness.  Over the last day she reports significant increase in the symptoms and decided to seek medical attention.  She does not report having dysarthria, dysphagia, chest pain or shortness of breath.  She has been off her usual antihypertensive blood pressure because she had allergic reaction to this a few weeks ago.  She tells me that previous to this her blood pressure was well controlled.  She does have a long-standing history of hypertension.  Patient denies any issue with the numbness of the legs, paresthesias of the chest or abdomen or symptoms on the right side.  No visual symptoms are reported in the past.  The review of systems otherwise negative.   GENERAL: This a pleasant female who is in no acute distress.  She does appears about 67 to 9 years older than stated age.  HEENT: Neck is supple without evidence of trauma.  ABDOMEN: soft  EXTREMITIES: No edema   BACK: This is normal.  SKIN: Normal by inspection.    MENTAL STATUS: Alert and oriented. Speech, language and cognition are generally intact. Judgment and insight normal.   CRANIAL NERVES: Pupils are equal, round and reactive to light and accomodation; extra ocular movements are  full, there is no significant nystagmus; visual fields are full; upper and lower facial muscles are normal in strength and symmetric, there is no flattening of the nasolabial folds; tongue is midline; uvula is midline; shoulder elevation is normal.  MOTOR: Normal tone, bulk and strength; no pronator drift.  COORDINATION: Left finger to nose is normal, right finger to nose is normal, No rest tremor; no intention tremor; no postural tremor; no bradykinesia.  REFLEXES: Deep tendon reflexes are symmetrical and normal.    SENSATION: Reduction to temperature and light touch involving the left upper extremity.       Blood pressure (!) 159/89, pulse 63, temperature 97.9 F (36.6 C), temperature source Oral, resp. rate 15, height _0  (1.702 m), weight 75 kg, SpO2 99 %.  Past Medical History:  Diagnosis Date  . GERD (gastroesophageal reflux disease)   . Hypercholesterolemia   . Hypertension   . Seasonal allergies     Past Surgical History:  Procedure Laterality Date  . APPENDECTOMY    . COLONOSCOPY  07/08/2010   Dr. Rourk:Normal rectum, polyp at the ileocecal valve status post piecemeal snare polypectomy followed by resolution and clipping Remainder of colonic mucosa appeared normal. PATH: tubular adenoma  . COLONOSCOPY N/A 11/14/2013   Procedure: COLONOSCOPY;  Surgeon: Daneil Dolin, MD;  [facility];  Service: Endoscopy;  Laterality: N/A;  8:30  . COLONOSCOPY N/A 12/16/2016   Procedure: COLONOSCOPY;  Surgeon: Daneil Dolin, MD;  [facility];  Service: Endoscopy;  Laterality: N/A;  1200  .  POLYPECTOMY  12/16/2016   Procedure: POLYPECTOMY-ASCENDING COLON;  Surgeon: Daneil Dolin, MD;  [facility];  Service: Endoscopy;;    Family History  Problem Relation Age of Onset  . Colon cancer Neg Hx     Social History:  reports that she has been smoking cigarettes. She has a 40.00 pack-year smoking history. She has never used smokeless tobacco. She  reports that she drinks alcohol. She reports that she does not use drugs.  Allergies:  Allergies  Allergen Reactions  . Benazepril     Medications: Prior to Admission medications   Medication Sig Start Date End Date Taking? Authorizing Provider  acetaminophen (TYLENOL) 650 MG CR tablet Take 650 mg by mouth at bedtime.   Yes [provider]  aspirin EC 81 MG tablet Take 81 mg by mouth daily.   Yes [provider]  atorvastatin (LIPITOR) 40 MG tablet Take 40 mg by mouth daily.   Yes [provider]  azelastine (ASTELIN) 0.1 % nasal spray Place 1-2 sprays into both nostrils daily. Use in each nostril as directed   Yes [provider]  BIOTIN PO Take 1 tablet by mouth daily.   Yes [provider]  cetirizine (ZYRTEC) 10 MG tablet Take 10 mg by mouth every other day. In the evening   Yes [provider]  [facility] Liver Oil CAPS Take 1 capsule by mouth daily with breakfast.   Yes [provider]  diphenhydrAMINE (BENADRYL) 25 MG tablet Take 25 mg by mouth every 6 (six) hours as needed.   Yes [provider]  FLUoxetine (PROZAC) 40 MG capsule Take 80 mg by mouth daily with breakfast.   Yes [provider]  fluticasone (FLONASE) 50 MCG/ACT nasal spray Place 1-2 sprays into both nostrils daily as needed for allergies or rhinitis.   Yes [provider]  ibuprofen (ADVIL,MOTRIN) 200 MG tablet Take 400 mg by mouth daily.    Yes [provider]  metoprolol tartrate (LOPRESSOR) 25 MG tablet Take 25 mg by mouth daily.   Yes [provider]  omeprazole (PRILOSEC) 20 MG capsule Take 20 mg by mouth daily before breakfast.    Yes [provider]  spironolactone (ALDACTONE) 25 MG tablet Take 25 mg by mouth daily with breakfast.   Yes [provider]  Vitamin D, Ergocalciferol, (DRISDOL) 50000 units CAPS capsule Take 50,000 Units by mouth 2 (two) times a week. Sunday & Thursday   Yes [provider]  polyethylene glycol-electrolytes (TRILYTE) 420 g solution Take 4,000 mLs by mouth as directed. Patient not taking: Reported on 11/08/2017 12/06/16   Daneil Dolin, MD    Scheduled Meds: Continuous Infusions: PRN Meds:.     Results for orders placed or performed during the hospital encounter of 11/08/17 (from the past 48 hour(s))  Ethanol     Status: None   Collection Time: 11/08/17 10:20 AM  Result Value Ref Range   Alcohol, Ethyl (B) <10 <10 mg/dL    Comment: (NOTE) Lowest detectable limit for serum alcohol is 10 mg/dL. For medical purposes only. Performed at [facility], [address], [place], [place] [address]   Protime-INR     Status: None   Collection Time: 11/08/17 10:20 AM  Result Value Ref Range   Prothrombin Time 12.7 11.4 - 15.2 seconds   INR 0.96     Comment: Performed at [facility], [address] [place], [place] [address]  APTT     Status: None  Collection Time: 11/08/17 10:20 AM  Result Value Ref Range   aPTT 25 24 - 36 seconds    Comment: Performed at [facility], [address], [place], [place] [address]  CBC     Status: None   Collection Time: 11/08/17 10:20 AM  Result Value Ref Range   WBC 4.5 4.0 - 10.5 K/uL   RBC 3.90 3.87 - 5.11 MIL/uL   Hemoglobin 12.3 12.0 - 15.0 g/dL   HCT 36.8 36.0 - 46.0 %   MCV 94.4 78.0 - 100.0 fL   MCH 31.5 26.0 - 34.0 pg   MCHC 33.4 30.0 - 36.0 g/dL   RDW 12.3 11.5 - 15.5 %   Platelets 231 150 - 400 K/uL    Comment: Performed at [facility], [address], [place], [place] [address]  Differential     Status: None   Collection Time: 11/08/17 10:20 AM  Result Value Ref Range   Neutrophils Relative % 56 %   Neutro Abs 2.5 1.7 - 7.7 K/uL   Lymphocytes Relative 34 %   Lymphs Abs 1.6 0.7 - 4.0 K/uL   Monocytes Relative 9 %   Monocytes Absolute 0.4 0.1 - 1.0 K/uL   Eosinophils Relative 1 %   Eosinophils Absolute 0.0 0.0 - 0.7 K/uL   Basophils Relative 0 %   Basophils Absolute 0.0 0.0 - 0.1 K/uL      Comment: Performed at [facility], [address] [place], [place] [address]  Comprehensive metabolic panel     Status: None   Collection Time: 11/08/17 10:20 AM  Result Value Ref Range   Sodium 140 135 - 145 mmol/L   Potassium 3.7 3.5 - 5.1 mmol/L   Chloride 109 98 - 111 mmol/L   CO2 23 22 - 32 mmol/L   Glucose, Bld 87 70 - 99 mg/dL   BUN 19 6 - 20 mg/dL   Creatinine, Ser 0.93 0.44 - 1.00 mg/dL   Calcium 9.0 8.9 - 10.3 mg/dL   Total Protein 7.3 6.5 - 8.1 g/dL   Albumin 3.9 3.5 - 5.0 g/dL   AST 29 15 - 41 U/L   ALT 29 0 - 44 U/L   Alkaline Phosphatase 81 38 - 126 U/L   Total Bilirubin 0.8 0.3 - 1.2 mg/dL   GFR calc non Af Amer >60 >60 mL/min   GFR calc Af Amer >60 >60 mL/min    Comment: (NOTE) The eGFR has been calculated using the CKD EPI equation. This calculation has not been validated in all clinical situations. eGFR's persistently <60 mL/min signify possible Chronic Kidney Disease.    Anion gap 8 5 - 15    Comment: Performed at [facility], [address], [place], [place] [address]  Urine rapid drug screen (hosp performed)     Status: None   Collection Time: 11/08/17 10:32 AM  Result Value Ref Range   Opiates NONE DETECTED NONE DETECTED   Cocaine NONE DETECTED NONE DETECTED   Benzodiazepines NONE DETECTED NONE DETECTED   Amphetamines NONE DETECTED NONE DETECTED   Tetrahydrocannabinol NONE DETECTED NONE DETECTED   Barbiturates NONE DETECTED NONE DETECTED    Comment: (NOTE) DRUG SCREEN FOR MEDICAL PURPOSES ONLY.  IF CONFIRMATION IS NEEDED FOR ANY PURPOSE, NOTIFY LAB WITHIN 5 DAYS. LOWEST DETECTABLE LIMITS FOR URINE DRUG SCREEN Drug Class                     Cutoff (ng/mL) Amphetamine and metabolites    1000 Barbiturate and  metabolites    200 Benzodiazepine                 409 Tricyclics and metabolites     300 Opiates and metabolites        300 Cocaine and metabolites        300 THC                            50 Performed at [facility], [address], [place], [place] [address]   Urinalysis, Routine w reflex microscopic     Status: None   Collection Time: 11/08/17 10:32 AM  Result Value Ref Range   Color, Urine YELLOW YELLOW   APPearance CLEAR CLEAR   Specific Gravity, Urine 1.011 1.005 - 1.030   pH 6.0 5.0 - 8.0   Glucose, UA NEGATIVE NEGATIVE mg/dL   Hgb urine dipstick NEGATIVE NEGATIVE   Bilirubin Urine NEGATIVE NEGATIVE   Ketones, ur NEGATIVE NEGATIVE mg/dL   Protein, ur NEGATIVE NEGATIVE mg/dL   Nitrite NEGATIVE NEGATIVE   Leukocytes, UA NEGATIVE NEGATIVE    Comment: Performed at [facility], [address], [place], [place] [address]  I-Stat beta hCG blood, ED     Status: None   Collection Time: 11/08/17 10:40 AM  Result Value Ref Range   I-stat hCG, quantitative <5.0 <5 mIU/mL   Comment 3            Comment:   GEST. AGE      CONC.  (mIU/mL)   <=1 WEEK        5 - 50     2 WEEKS       50 - 500     3 WEEKS       100 - 10,000     4 WEEKS     1,000 - 30,000        FEMALE AND NON-PREGNANT FEMALE:     LESS THAN 5 mIU/mL   I-Stat Chem 8, ED     Status: Abnormal   Collection Time: 11/08/17 10:47 AM  Result Value Ref Range   Sodium 141 135 - 145 mmol/L   Potassium 3.5 3.5 - 5.1 mmol/L   Chloride 109 98 - 111 mmol/L   BUN 20 6 - 20 mg/dL   Creatinine, Ser 0.90 0.44 - 1.00 mg/dL   Glucose, Bld 84 70 - 99 mg/dL   Calcium, Ion 1.21 1.15 - 1.40 mmol/L   TCO2 21 (L) 22 - 32 mmol/L   Hemoglobin 12.6 12.0 - 15.0 g/dL   HCT 37.0 36.0 - 46.0 %  I-stat troponin, ED     Status: None   Collection Time: 11/08/17 10:48 AM  Result Value Ref Range   Troponin i, poc 0.00 0.00 - 0.08 ng/mL   Comment 3            Comment: Due to the release kinetics of cTnI, a negative result within the first hours of the onset of symptoms does not rule out myocardial infarction with certainty. If myocardial infarction is still suspected, repeat the test at appropriate intervals.     Studies/Results:  MRI BRAIN FINDINGS: Brain: The  diffusion-weighted images demonstrate no evidence for acute or subacute infarction. Scattered subcortical T2 hyperintensities are mildly advanced for age. Prominent white matter changes are noted in the pons. Cerebellum is unremarkable. No acute hemorrhage or mass lesion is present. Ventricles are of normal size. No significant extra-axial fluid collection is present.  Vascular: Flow is present in the major intracranial arteries.  Skull and upper cervical spine: Skull base is within normal limits. The upper cervical spine is within normal limits. Marrow signal is normal.  Sinuses/Orbits: A polyp or mucous retention cyst is present at the floor of the right maxillary sinus. The paranasal sinuses and mastoid air cells are clear. Globes and orbits are within limits.  IMPRESSION: 1. No acute or subacute infarct. 2. Scattered subcortical and brainstem T2 signal hyperintensities are mildly advanced for age. The finding is nonspecific but can be seen in the setting of chronic microvascular ischemia, a demyelinating process such as multiple sclerosis, vasculitis, complicated migraine headaches, or as the sequelae of a prior infectious or inflammatory process.     The brain MRI is reviewed in person.  Nothing acute is seen on DWI.  No hemorrhages appreciated.  FLAIR imaging shows about 10 deep white matter moderate size plaque like white matter lesions almost this is clues flea on the right side particularly the right frontal area.  There is some minor increased signal in the pontine regions bilaterally.  No encephalomalacia seen on T1.  No hemorrhages appreciated.   Dwyane Dupree A. Merlene Laughter, M.D.  Diplomate, Tax adviser of Psychiatry and Neurology ( Neurology). 11/08/2017, 5:40 PM

## 2017-11-08 NOTE — Progress Notes (Signed)
Patient Stroke Swallow Evaluation completed and patient passed with no difficulty. Dr. Jerilee Hoh text paged to advance diet. Will continue to monitor.

## 2017-11-08 NOTE — ED Notes (Signed)
Attempted to give report to 300 RN.

## 2017-11-08 NOTE — ED Notes (Signed)
Report given to 300 RN. 

## 2017-11-08 NOTE — ED Provider Notes (Signed)
Emergency Department Provider Note   I have reviewed the triage vital signs and the nursing notes.   HISTORY  Chief Complaint Numbness   HPI Jennifer Browning is a 57 y.o. female with a history of hypertension and hyperlipidemia the presents to the emergency department today secondary to paresthesias on the left side.  Patient states these have been there for approximately a week waxing and waning in intensity but generally present.  States it feels like when her arm goes asleep.  At that her left face left arm left leg.  She says that she walks a little bit abnormally as well like she keeps falling to the side.  She has not noticed any decreased strength.  No vision changes.  No chest pain, shortness of breath or leg swelling.  She does have decreased urine output.  She has never had symptoms like this before. No other associated or modifying symptoms.    Past Medical History:  Diagnosis Date  . GERD (gastroesophageal reflux disease)   . Hypercholesterolemia   . Hypertension   . Seasonal allergies     Patient Active Problem List   Diagnosis Date Noted  . HTN (hypertension), malignant 11/08/2017  . Unspecified constipation 10/31/2013  . Personal history of colonic polyps 10/31/2013    Past Surgical History:  Procedure Laterality Date  . APPENDECTOMY    . COLONOSCOPY  07/08/2010   Dr. Rourk:Normal rectum, polyp at the ileocecal valve status post piecemeal snare polypectomy followed by resolution and clipping Remainder of colonic mucosa appeared normal. PATH: tubular adenoma  . COLONOSCOPY N/A 11/14/2013   Procedure: COLONOSCOPY;  Surgeon: Daneil Dolin, MD;  [facility];  Service: Endoscopy;  Laterality: N/A;  8:30  . COLONOSCOPY N/A 12/16/2016   Procedure: COLONOSCOPY;  Surgeon: Daneil Dolin, MD;  [facility];  Service: Endoscopy;  Laterality: N/A;  1200  . POLYPECTOMY  12/16/2016   Procedure: POLYPECTOMY-ASCENDING COLON;  Surgeon: Daneil Dolin, MD;  [facility];  Service: Endoscopy;;    Current Outpatient Rx  . Order #: [id number] Class: Historical Med  . Order #: [id number] Class: Historical Med  . Order #: [id number] Class: Historical Med  . Order #: [id number] Class: Historical Med  . Order #: [id number] Class: Historical Med  . Order #: [id number] Class: Historical Med  . Order #: [id number] Class: Historical Med  . Order #: [id number] Class: Historical Med  . Order #: [id number] Class: Historical Med  . Order #: [id number] Class: Historical Med  . Order #: [id number] Class: Historical Med  . Order #: [id number] Class: Historical Med  . Order #: [id number] Class: Historical Med  . Order #: [id number] Class: Historical Med  . Order #: [id number] Class: Historical Med  . Order #: [id number] Class: Normal    Allergies Benazepril  Family History  Problem Relation Age of Onset  . Colon cancer Neg Hx     Social History Social History   Tobacco Use  . Smoking status: Current Every Day Smoker    Packs/day: 1.00    Years: 40.00    Pack years: 40.00    Types: Cigarettes  . Smokeless tobacco: Never Used  Substance Use Topics  . Alcohol use: Yes    Comment: a drink of brandy 2-3 times per week  . Drug use: No    Review of Systems  All other systems negative except as documented in the HPI. All pertinent positives and negatives as reviewed in the HPI. ____________________________________________   PHYSICAL EXAM:  VITAL SIGNS: ED Triage Vitals  Enc Vitals  Group     BP 11/08/17 1000 (!) 181/101     Pulse Rate 11/08/17 1000 61     Resp 11/08/17 1006 15     Temp --      Temp src --      SpO2 11/08/17 1000 100 %    Constitutional: Alert and oriented. Well appearing and in no acute distress. Eyes: Conjunctivae are normal. PERRL. EOMI. Head: Atraumatic. Nose: No congestion/rhinnorhea. Mouth/Throat: Mucous membranes are moist.  Oropharynx non-erythematous. Neck: No stridor.  No meningeal signs.   Cardiovascular:  Normal rate, regular rhythm. Good peripheral circulation. Grossly normal heart sounds.   Respiratory: Normal respiratory effort.  No retractions. Lungs CTAB. Gastrointestinal: Soft and nontender. No distention.  Musculoskeletal: No lower extremity tenderness nor edema. No gross deformities of extremities. Neurologic: Mildly diminished strength in the left compared to the right arm.  No other focal motor deficits.  She does have decreased sensation to the left side of face, whole left arm and whole left leg.  She can feel light touch but feels it less so than she does on the right.  She has normal patellar and bicep reflexes.  Renal nerves are otherwise with normal motor, eye lid opening and closing, mouth movements, tongue midline and uvula midline and no decrease in hearing. Skin:  Skin is warm, dry and intact. No rash noted.   ____________________________________________   LABS (all labs ordered are listed, but only abnormal results are displayed)  Labs Reviewed  I-STAT CHEM 8, ED - Abnormal; Notable for the following components:      Result Value   TCO2 21 (*)    All other components within normal limits  ETHANOL  PROTIME-INR  APTT  CBC  DIFFERENTIAL  COMPREHENSIVE METABOLIC PANEL  RAPID URINE DRUG SCREEN, HOSP PERFORMED  URINALYSIS, ROUTINE W REFLEX MICROSCOPIC  I-STAT TROPONIN, ED  I-STAT BETA HCG BLOOD, ED ([facility], [facility], [facility] ONLY)   ____________________________________________  EKG   EKG Interpretation  Date/Time:  Tuesday November 08 2017 10:06:32 EDT Ventricular Rate:  60 PR Interval:    QRS Duration: 104 QT Interval:  435 QTC Calculation: 435 R Axis:   71 Text Interpretation:  Sinus rhythm No old tracing to compare Confirmed by Merrily Pew [phone]) on 11/08/2017 12:46:46 PM       ____________________________________________  RADIOLOGY  Dg Chest 2 View  Result Date: 11/08/2017 CLINICAL DATA:  Elevated blood pressure, numbness on LEFT side this morning, history  hypertension EXAM: CHEST - 2 VIEW COMPARISON:  10/02/2014 FINDINGS: Normal heart size, mediastinal contours, and pulmonary vascularity. Lungs clear. No pulmonary infiltrate, pleural effusion or pneumothorax. Bones unremarkable. IMPRESSION: No acute abnormalities. Electronically Signed   By: Lavonia Dana M.D.   On: 11/08/2017 11:11   Mr Brain Wo Contrast  Result Date: 11/08/2017 CLINICAL DATA:  Left-sided numbness face arm for 1 week. Symptoms worse today. Neuro deficit, greater than 6 hours, stroke suspected. EXAM: MRI HEAD WITHOUT CONTRAST TECHNIQUE: Multiplanar, multiecho pulse sequences of the brain and surrounding structures were obtained without intravenous contrast. COMPARISON:  None. FINDINGS: Brain: The diffusion-weighted images demonstrate no evidence for acute or subacute infarction. Scattered subcortical T2 hyperintensities are mildly advanced for age. Prominent white matter changes are noted in the pons. Cerebellum is unremarkable. No acute hemorrhage or mass lesion is present. Ventricles are of normal size. No significant extra-axial fluid collection is present. Vascular: Flow is present in the major intracranial arteries. Skull and upper cervical spine: Skull base is within normal limits. The upper cervical  spine is within normal limits. Marrow signal is normal. Sinuses/Orbits: A polyp or mucous retention cyst is present at the floor of the right maxillary sinus. The paranasal sinuses and mastoid air cells are clear. Globes and orbits are within limits. IMPRESSION: 1. No acute or subacute infarct. 2. Scattered subcortical and brainstem T2 signal hyperintensities are mildly advanced for age. The finding is nonspecific but can be seen in the setting of chronic microvascular ischemia, a demyelinating process such as multiple sclerosis, vasculitis, complicated migraine headaches, or as the sequelae of a prior infectious or inflammatory process. Electronically Signed   By: San Morelle M.D.   On:  11/08/2017 12:02    ____________________________________________   PROCEDURES  Procedure(s) performed:   Procedures  CRITICAL CARE Performed by: Merrily Pew Total critical care time: 35 minutes Critical care time was exclusive of separately billable procedures and treating other patients. Critical care was necessary to treat or prevent imminent or life-threatening deterioration. Critical care was time spent personally by me on the following activities: development of treatment plan with patient and/or surrogate as well as nursing, discussions with consultants, evaluation of patient's response to treatment, examination of patient, obtaining history from patient or surrogate, ordering and performing treatments and interventions, ordering and review of laboratory studies, ordering and review of radiographic studies, pulse oximetry and re-evaluation of patient's condition.  ____________________________________________   INITIAL IMPRESSION / ASSESSMENT AND PLAN / ED COURSE  Patient with hypertension and neuro deficits or work on try to get her blood pressure better.  At same time will evaluate with MRI to make sure she does have a stroke.  No CT scan at this time and low suspicion for head bleed and thus the decision to go straight to MRI.  MRI with nonspecific findings. Will d/w neurology about the need for contrast? BP still elevated even after metoprolol. Will wait until after d/w neuro prior to more antihypertensives.   Discussed with Dr. Merlene Laughter who thinks is likely related to blood pressure rather than a primary neurologic issue.  He recommends admitting to the hospital to get blood pressure under control and if she is still symptomatic then consult him for further management. IV hydralazine given with some improvement in pressures.   Discussed with hospitalist for observation.  Pertinent labs & imaging results that were available during my care of the patient were reviewed by me  and considered in my medical decision making (see chart for details).  ____________________________________________  FINAL CLINICAL IMPRESSION(S) / ED DIAGNOSES  Final diagnoses:  Hypertensive crisis     MEDICATIONS GIVEN DURING THIS VISIT:  Medications  metoprolol tartrate (LOPRESSOR) injection 5 mg (5 mg Intravenous Given 11/08/17 1038)  hydrALAZINE (APRESOLINE) injection 5 mg (5 mg Intravenous Given 11/08/17 1356)     NEW OUTPATIENT MEDICATIONS STARTED DURING THIS VISIT:  New Prescriptions   No medications on file    Note:  This note was prepared with assistance of Dragon voice recognition software. Occasional wrong-word or sound-a-like substitutions may have occurred due to the inherent limitations of voice recognition software.   Merrily Pew, MD 11/08/17 1517

## 2017-11-08 NOTE — ED Notes (Signed)
Patient transport to xray and MRI.

## 2017-11-09 ENCOUNTER — Observation Stay (HOSPITAL_COMMUNITY): Payer: BLUE CROSS/BLUE SHIELD

## 2017-11-09 DIAGNOSIS — R531 Weakness: Secondary | ICD-10-CM | POA: Diagnosis not present

## 2017-11-09 LAB — C-REACTIVE PROTEIN

## 2017-11-09 LAB — HIV ANTIBODY (ROUTINE TESTING W REFLEX): HIV SCREEN 4TH GENERATION: NONREACTIVE

## 2017-11-09 MED ORDER — GADOBENATE DIMEGLUMINE 529 MG/ML IV SOLN
15.0000 mL | Freq: Once | INTRAVENOUS | Status: AC | PRN
  Administered 2017-11-09: 15 mL via INTRAVENOUS

## 2017-11-09 MED ORDER — METOPROLOL SUCCINATE ER 50 MG PO TB24: 50.0000 mg | ORAL_TABLET | Freq: Every day | ORAL | 0 refills | Status: DC

## 2017-11-09 NOTE — Progress Notes (Signed)
IV removed and dc instructions reviewed.

## 2017-11-09 NOTE — Discharge Summary (Signed)
Physician Discharge Summary  Jennifer Browning EPP:[id number] DOB: [birth year] DOA: 11/08/2017  PCP: Renee Rival, NP  Admit date: 11/08/2017  Discharge date: 11/09/2017  Admitted From:Home  Disposition:  Home  Recommendations for Outpatient Follow-up:  1. Follow up with PCP in 1-2 weeks 2. Follow up with Dr. Merlene Laughter in 6 weeks  Home Health:N/A  Equipment/Devices:None  Discharge Condition:Stable  CODE STATUS: Full  Diet recommendation: Heart Healthy  Brief/Interim Summary:  Jennifer Browning is a 57 y.o. female with history of hypertension and hyperlipidemia as well as GERD who states that for the past 2 to 3 weeks she has noted blood pressures at home to be in the 1 60-630 range systolic.  She states that 3 weeks ago she was taken off the benazepril that she had been on for years as she was noticed to have angioedema.  For the past 2 days she has noted some left-sided face and arm numbness and decided to come to the hospital for evaluation. She was noted to have some abnormal MRI findings on her initial Brain MRI with scattered subcortical and brainstem T2 signal hyperintensities which was followed up further with Contrast MRI along with Cervical MRI. No further acute findings were noted aside from some foraminal stenosis on the L C6 nerve root that could explain some of the L arm numbness. She is stable for DC today with a higher dose of metoprolol given prior to DC. She will follow up with her PCP and participate in outpatient PT and see Dr. Merlene Laughter back in the office in 6 weeks.  Discharge Diagnoses:  Principal Problem:   Left-sided weakness Active Problems:   HTN (hypertension), malignant    Discharge Instructions  Discharge Instructions    Diet - low sodium heart healthy   Complete by:  As directed    Increase activity slowly   Complete by:  As directed      Allergies as of 11/09/2017      Reactions   Benazepril       Medication List    STOP  taking these medications   metoprolol tartrate 25 MG tablet Commonly known as:  LOPRESSOR     TAKE these medications   acetaminophen 650 MG CR tablet Commonly known as:  TYLENOL Take 650 mg by mouth at bedtime.   aspirin EC 81 MG tablet Take 81 mg by mouth daily.   atorvastatin 40 MG tablet Commonly known as:  LIPITOR Take 40 mg by mouth daily.   azelastine 0.1 % nasal spray Commonly known as:  ASTELIN Place 1-2 sprays into both nostrils daily. Use in each nostril as directed   BIOTIN PO Take 1 tablet by mouth daily.   cetirizine 10 MG tablet Commonly known as:  ZYRTEC Take 10 mg by mouth every other day. In the evening   Cod Liver Oil Caps Take 1 capsule by mouth daily with breakfast.   diphenhydrAMINE 25 MG tablet Commonly known as:  BENADRYL Take 25 mg by mouth every 6 (six) hours as needed.   FLUoxetine 40 MG capsule Commonly known as:  PROZAC Take 80 mg by mouth daily with breakfast.   fluticasone 50 MCG/ACT nasal spray Commonly known as:  FLONASE Place 1-2 sprays into both nostrils daily as needed for allergies or rhinitis.   ibuprofen 200 MG tablet Commonly known as:  ADVIL,MOTRIN Take 400 mg by mouth daily.   metoprolol succinate 50 MG 24 hr tablet Commonly known as:  TOPROL-XL Take 1 tablet (50 mg total)  by mouth daily. Take with or immediately following a meal. Start taking on:  11/10/2017   omeprazole 20 MG capsule Commonly known as:  PRILOSEC Take 20 mg by mouth daily before breakfast.   polyethylene glycol-electrolytes 420 g solution Commonly known as:  NuLYTELY/GoLYTELY Take 4,000 mLs by mouth as directed.   spironolactone 25 MG tablet Commonly known as:  ALDACTONE Take 25 mg by mouth daily with breakfast.   Vitamin D (Ergocalciferol) 50000 units Caps capsule Commonly known as:  DRISDOL Take 50,000 Units by mouth 2 (two) times a week. Sunday & Thursday      Follow-up Information    Renee Rival, NP. Schedule an appointment as  soon as possible for a visit in 1 week(s).   Specialty:  Nurse Practitioner Contact information: [address] [place] [address] [phone]        Phillips Odor, MD. Call in 6 week(s).   Specialty:  Neurology Contact information: [address] [place] [address] [phone]          Allergies  Allergen Reactions  . Benazepril     Consultations:  Neurology   Procedures/Studies: Dg Chest 2 View  Result Date: 11/08/2017 CLINICAL DATA:  Elevated blood pressure, numbness on LEFT side this morning, history hypertension EXAM: CHEST - 2 VIEW COMPARISON:  10/02/2014 FINDINGS: Normal heart size, mediastinal contours, and pulmonary vascularity. Lungs clear. No pulmonary infiltrate, pleural effusion or pneumothorax. Bones unremarkable. IMPRESSION: No acute abnormalities. Electronically Signed   By: Lavonia Dana M.D.   On: 11/08/2017 11:11   Mr Brain Wo Contrast  Result Date: 11/08/2017 CLINICAL DATA:  Left-sided numbness face arm for 1 week. Symptoms worse today. Neuro deficit, greater than 6 hours, stroke suspected. EXAM: MRI HEAD WITHOUT CONTRAST TECHNIQUE: Multiplanar, multiecho pulse sequences of the brain and surrounding structures were obtained without intravenous contrast. COMPARISON:  None. FINDINGS: Brain: The diffusion-weighted images demonstrate no evidence for acute or subacute infarction. Scattered subcortical T2 hyperintensities are mildly advanced for age. Prominent white matter changes are noted in the pons. Cerebellum is unremarkable. No acute hemorrhage or mass lesion is present. Ventricles are of normal size. No significant extra-axial fluid collection is present. Vascular: Flow is present in the major intracranial arteries. Skull and upper cervical spine: Skull base is within normal limits. The upper cervical spine is within normal limits. Marrow signal is normal. Sinuses/Orbits: A polyp or mucous retention cyst is present at the floor of the right  maxillary sinus. The paranasal sinuses and mastoid air cells are clear. Globes and orbits are within limits. IMPRESSION: 1. No acute or subacute infarct. 2. Scattered subcortical and brainstem T2 signal hyperintensities are mildly advanced for age. The finding is nonspecific but can be seen in the setting of chronic microvascular ischemia, a demyelinating process such as multiple sclerosis, vasculitis, complicated migraine headaches, or as the sequelae of a prior infectious or inflammatory process. Electronically Signed   By: San Morelle M.D.   On: 11/08/2017 12:02   Mr Brain W Contrast  Result Date: 11/09/2017 CLINICAL DATA:  LEFT-sided face and arm numbness. EXAM: MRI HEAD WITH CONTRAST TECHNIQUE: Multiplanar, multiecho pulse sequences of the brain and surrounding structures were obtained with intravenous contrast. CONTRAST:  39mL MULTIHANCE GADOBENATE DIMEGLUMINE 529 MG/ML IV SOLN COMPARISON:  Noncontrast exam 11/08/2017. FINDINGS: Brain: Post infusion, no abnormal enhancement of the brain or meninges. Vascular: Flow voids are maintained. Skull and upper cervical spine: Normal marrow signal. Sinuses/Orbits: No acute findings. Other: None. IMPRESSION: Normal post infusion imaging  of the brain. None of the white matter abnormalities demonstrated on yesterday's noncontrast exam demonstrate blood brain barrier breakdown. Electronically Signed   By: Staci Righter M.D.   On: 11/09/2017 11:04   Mr Cervical Spine W Wo Contrast  Result Date: 11/09/2017 CLINICAL DATA:  LEFT face and arm numbness. EXAM: MRI CERVICAL SPINE WITHOUT AND WITH CONTRAST TECHNIQUE: Multiplanar and multiecho pulse sequences of the cervical spine, to include the craniocervical junction and cervicothoracic junction, were obtained without and with intravenous contrast. CONTRAST:  62mL MULTIHANCE GADOBENATE DIMEGLUMINE 529 MG/ML IV SOLN COMPARISON:  MR brain reported separately. FINDINGS: Alignment: Straightening of the normal cervical  lordosis. 2 mm retrolisthesis C5-C6. 1 mm anterolisthesis C4-5. Vertebrae: Chronic endplate reactive changes above and below C5-6. No abnormal postcontrast enhancement. Cord: Normal signal and morphology. Posterior Fossa, vertebral arteries, paraspinal tissues: Posterior fossa reported separately. Flow voids are maintained in the vertebral arteries. No neck masses. Disc levels: C2-3:  Normal. C3-4:  Normal. C4-5: 1 mm facet mediated anterolisthesis. Annular bulge. Slight LEFT C5 foraminal narrowing. C5-6: 2 mm retrolisthesis. Disc space narrowing. Osseous spurring/disc material protrudes into the retropharynx. Disc osteophyte complex projects posteriorly and is associated with LEFT greater than RIGHT uncinate spurring, and associated LEFT worse than RIGHT C6 foraminal narrowing. C6-7: Shallow central protrusion/extrusion, possible slight cephalad migration. No impingement. C7-T1:  Unremarkable. IMPRESSION: No evidence for demyelinating disease in the cord or intraspinal mass lesion. Multilevel spondylosis, most pronounced at C5-6. Correlate clinically for symptomatic LEFT C6 foraminal narrowing as a cause of LEFT arm numbness. Electronically Signed   By: Staci Righter M.D.   On: 11/09/2017 11:16     Discharge Exam: Vitals:   11/08/17 2134 11/09/17 0523  BP: 133/80 (!) 159/91  Pulse: 64 65  Resp: 16 18  Temp: 98.3 F (36.8 C) 98.2 F (36.8 C)  SpO2: 99% 99%   Vitals:   11/08/17 1837 11/08/17 2108 11/08/17 2134 11/09/17 0523  BP: (!) 166/79  133/80 (!) 159/91  Pulse: 67  64 65  Resp: 20  16 18   Temp: (!) 97.4 F (36.3 C)  98.3 F (36.8 C) 98.2 F (36.8 C)  TempSrc: Oral  Oral Oral  SpO2: 100% 97% 99% 99%  Weight:      Height:        General: Pt is alert, awake, not in acute distress Cardiovascular: RRR, S1/S2 +, no rubs, no gallops Respiratory: CTA bilaterally, no wheezing, no rhonchi Abdominal: Soft, NT, ND, bowel sounds + Extremities: no edema, no cyanosis    The results of  significant diagnostics from this hospitalization (including imaging, microbiology, ancillary and laboratory) are listed below for reference.     Microbiology: No results found for this or any previous visit (from the past 240 hour(s)).   Labs: BNP (last 3 results) No results for input(s): BNP in the last 8760 hours. Basic Metabolic Panel: Recent Labs  Lab 11/08/17 1020 11/08/17 1047 11/08/17 1821  NA 140 141  --   K 3.7 3.5  --   CL 109 109  --   CO2 23  --   --   GLUCOSE 87 84  --   BUN 19 20  --   CREATININE 0.93 0.90 0.85  CALCIUM 9.0  --   --    Liver Function Tests: Recent Labs  Lab 11/08/17 1020  AST 29  ALT 29  ALKPHOS 81  BILITOT 0.8  PROT 7.3  ALBUMIN 3.9   No results for input(s): LIPASE, AMYLASE in the last  168 hours. No results for input(s): AMMONIA in the last 168 hours. CBC: Recent Labs  Lab 11/08/17 1020 11/08/17 1047 11/08/17 1821  WBC 4.5  --  5.0  NEUTROABS 2.5  --   --   HGB 12.3 12.6 12.2  HCT 36.8 37.0 36.9  MCV 94.4  --  94.1  PLT 231  --  233   Cardiac Enzymes: No results for input(s): CKTOTAL, CKMB, CKMBINDEX, TROPONINI in the last 168 hours. BNP: Invalid input(s): POCBNP CBG: No results for input(s): GLUCAP in the last 168 hours. D-Dimer No results for input(s): DDIMER in the last 72 hours. Hgb A1c No results for input(s): HGBA1C in the last 72 hours. Lipid Profile No results for input(s): CHOL, HDL, LDLCALC, TRIG, CHOLHDL, LDLDIRECT in the last 72 hours. Thyroid function studies Recent Labs    11/08/17 1821  TSH 1.589   Anemia work up Recent Labs    11/08/17 1821  VITAMINB12 402   Urinalysis    Component Value Date/Time   COLORURINE YELLOW 11/08/2017 [place] 11/08/2017 1032   LABSPEC 1.011 11/08/2017 1032   PHURINE 6.0 11/08/2017 1032   GLUCOSEU NEGATIVE 11/08/2017 1032   HGBUR NEGATIVE 11/08/2017 1032   BILIRUBINUR NEGATIVE 11/08/2017 1032   KETONESUR NEGATIVE 11/08/2017 1032   PROTEINUR  NEGATIVE 11/08/2017 1032   UROBILINOGEN 0.2 10/04/2014 0800   NITRITE NEGATIVE 11/08/2017 1032   LEUKOCYTESUR NEGATIVE 11/08/2017 1032   Sepsis Labs Invalid input(s): PROCALCITONIN,  WBC,  LACTICIDVEN Microbiology No results found for this or any previous visit (from the past 240 hour(s)).   Time coordinating discharge: 35 minutes  SIGNED:   Rodena Goldmann, DO [facility] 11/09/2017, 1:54 PM Pager [phone]  If 7PM-7AM, please contact night-coverage [email or web address] Password [email or web address]

## 2017-11-10 LAB — RPR: RPR Ser Ql: NONREACTIVE

## 2018-10-23 ENCOUNTER — Other Ambulatory Visit (HOSPITAL_COMMUNITY): Payer: Self-pay | Admitting: Student

## 2018-10-23 DIAGNOSIS — Z1231 Encounter for screening mammogram for malignant neoplasm of breast: Secondary | ICD-10-CM

## 2019-10-26 ENCOUNTER — Other Ambulatory Visit (HOSPITAL_COMMUNITY): Payer: Self-pay | Admitting: Family Medicine

## 2019-10-26 DIAGNOSIS — Z1231 Encounter for screening mammogram for malignant neoplasm of breast: Secondary | ICD-10-CM

## 2019-11-23 ENCOUNTER — Ambulatory Visit (HOSPITAL_COMMUNITY): Payer: BLUE CROSS/BLUE SHIELD

## 2019-11-26 ENCOUNTER — Other Ambulatory Visit: Payer: Self-pay

## 2019-11-26 ENCOUNTER — Ambulatory Visit (HOSPITAL_COMMUNITY)
Admission: RE | Admit: 2019-11-26 | Discharge: 2019-11-26 | Disposition: A | Discharge status: Home / Self Care | Payer: No Typology Code available for payment source | Source: Ambulatory Visit | Attending: Family Medicine | Admitting: Family Medicine

## 2019-11-26 DIAGNOSIS — Z1231 Encounter for screening mammogram for malignant neoplasm of breast: Secondary | ICD-10-CM | POA: Diagnosis present

## 2020-05-27 ENCOUNTER — Encounter: Payer: Self-pay | Admitting: Internal Medicine

## 2020-07-08 ENCOUNTER — Encounter: Payer: Self-pay | Admitting: Nurse Practitioner

## 2020-07-08 ENCOUNTER — Other Ambulatory Visit: Payer: Self-pay

## 2020-07-08 ENCOUNTER — Ambulatory Visit (INDEPENDENT_AMBULATORY_CARE_PROVIDER_SITE_OTHER): Payer: No Typology Code available for payment source | Admitting: Nurse Practitioner

## 2020-07-08 DIAGNOSIS — R197 Diarrhea, unspecified: Secondary | ICD-10-CM | POA: Diagnosis not present

## 2020-07-08 DIAGNOSIS — R112 Nausea with vomiting, unspecified: Secondary | ICD-10-CM

## 2020-07-08 DIAGNOSIS — R109 Unspecified abdominal pain: Secondary | ICD-10-CM | POA: Insufficient documentation

## 2020-07-08 DIAGNOSIS — R1084 Generalized abdominal pain: Secondary | ICD-10-CM

## 2020-07-08 MED ORDER — PEG 3350-KCL-NA BICARB-NACL 420 G PO SOLR: 4000.0000 mL | ORAL | 0 refills | Status: AC

## 2020-07-08 NOTE — Patient Instructions (Signed)
Your health issues we discussed today were:   Abdominal pain with diarrhea and nausea: 1. I am sorry not feeling well! 2. Collect a stool collection kit from the lab 3. Complete your stool studies and bring them back to the lab as soon as you are able to 4. As we discussed, your stool sample will need to be liquid 5. We will call you when we get the results 6. If your stool studies are negative for infection and we can make recommendations for medication to help you feel better 7. We will schedule your colonoscopy for you 8. Further recommendations will follow your colonoscopy 9. Call us if you have any worsening or severe symptoms  Overall I recommend:  1. Continue other current medications 2. Return for follow-up in 2 months 3. Call us for any questions or concerns   ---------------------------------------------------------------  I am glad you have gotten your COVID-19 vaccination!  Even though you are fully vaccinated you should continue to follow CDC and state/local guidelines.  ---------------------------------------------------------------   At [facility] we value your feedback. You may receive a survey about your visit today. Please share your experience as we strive to create trusting relationships with our patients to provide genuine, compassionate, quality care.  We appreciate your understanding and patience as we review any laboratory studies, imaging, and other diagnostic tests that are ordered as we care for you. Our office policy is 5 business days for review of these results, and any emergent or urgent results are addressed in a timely manner for your best interest. If you do not hear from our office in 1 week, please contact us.   We also encourage the use of MyChart, which contains your medical information for your review as well. If you are not enrolled in this feature, an access code is on this after visit summary for your convenience. Thank you for  allowing [place] to be involved in your care.  It was great to see you today!  I hope you have a great summer!!

## 2020-07-08 NOTE — Progress Notes (Signed)
Primary Care Physician:  Ludwig Clarks, FNP Primary Gastroenterologist:  Dr. Gala Romney  Chief Complaint  Patient presents with  . change in bowels    Watery stools, then will have constipation a lot  . Nausea    All last week with some vomiting    HPI:   Jennifer Browning is a 60 y.o. female who presents on referral from primary care to schedule colonoscopy.  Her colonoscopy is not technically due until 2023, however the patient is having loose stools and nausea.  The patient has not been seen by our office since 10/31/2013 when she was presenting to schedule a colonoscopy with a history of tubular adenoma and piecemeal polypectomy in 2012.  Has constipation complaints at that time and associated abdominal discomfort.  No nausea or vomiting at that time.  Notes occasional hemorrhoids that will protrude.  Recommended Linzess once daily for constipation, hemorrhoid cream twice daily for 7 days, colonoscopy.  Colonoscopy most recently completed 12/16/2016 which found four 4 to 6 mm polyps in the ascending colon, otherwise normal.  Surgical pathology found the polyps to be tubular adenoma.  Recommended repeat colonoscopy in 5 years (2023).  Today she states she is doing okay overall. She has been having abdominal discomfort associated with diarrhea for the past month. She has about 5-8 stools a day. Stools are diarrhea to watery. Feels like she goes more at work, thinks it may due to all the standing. Generally doesn't have a lot of stress at work. Denies any known sick contacts, no new foods/dietary changes. She drinks bottled. Last week she felt pretty bad, was having a lot of nausea and some vomiting; last week if she ate or drank she would either vomit or have diarrhea. Has had a couple episodes of scant tissue hematochezia when having frequent diarrhea which she feels like is due to a lot of wiping. Stools seem to be smaller in caliber when they do have form to them. Denies melena, fever,  chills, unintentional weight loss. Denies URI or flu-like symptoms. Denies loss of sense of taste or smell. The patient has received COVID-19 vaccination(s). Denies chest pain, dyspnea, dizziness, lightheadedness, syncope, near syncope. Denies any other upper or lower GI symptoms.  Past Medical History:  Diagnosis Date  . GERD (gastroesophageal reflux disease)   . Hypercholesterolemia   . Hypertension   . Seasonal allergies     Past Surgical History:  Procedure Laterality Date  . APPENDECTOMY    . COLONOSCOPY  07/08/2010   Dr. Rourk:Normal rectum, polyp at the ileocecal valve status post piecemeal snare polypectomy followed by resolution and clipping Remainder of colonic mucosa appeared normal. PATH: tubular adenoma  . COLONOSCOPY N/A 11/14/2013   Procedure: COLONOSCOPY;  Surgeon: Daneil Dolin, MD;  [facility];  Service: Endoscopy;  Laterality: N/A;  8:30  . COLONOSCOPY N/A 12/16/2016   Procedure: COLONOSCOPY;  Surgeon: Daneil Dolin, MD;  [facility]: [facility];  Service: Endoscopy;  Laterality: N/A;  1200  . POLYPECTOMY  12/16/2016   Procedure: POLYPECTOMY-ASCENDING COLON;  Surgeon: Daneil Dolin, MD;  Location: [facility];  Service: Endoscopy;;    Current Outpatient Medications  Medication Sig Dispense Refill  . atorvastatin (LIPITOR) 80 MG tablet Take 80 mg by mouth daily.    Marland Kitchen BIOTIN PO Take 1 tablet by mouth daily.    Marland Kitchen Cod Liver Oil CAPS Take 1 capsule by mouth daily with breakfast.    . FLUoxetine (PROZAC) 40 MG capsule  Take 80 mg by mouth daily with breakfast.    . ibuprofen (ADVIL,MOTRIN) 200 MG tablet Take 400 mg by mouth daily.     . metoprolol tartrate (LOPRESSOR) 100 MG tablet Take 100 mg by mouth 2 (two) times daily.    . Multiple Vitamin (MULTIVITAMIN) tablet Take 1 tablet by mouth daily.    Marland Kitchen omeprazole (PRILOSEC) 20 MG capsule Take 20 mg by mouth daily before breakfast.    . spironolactone (ALDACTONE) 50 MG tablet Take 50 mg by mouth daily with  breakfast.     No current facility-administered medications for this visit.    Allergies as of 07/08/2020 - Review Complete 07/08/2020  Allergen Reaction Noted  . Benazepril  11/08/2017    Family History  Problem Relation Age of Onset  . Colon cancer Neg Hx     Social History   Socioeconomic History  . Marital status: Married    Spouse name: Not on file  . Number of children: Not on file  . Years of education: Not on file  . Highest education level: Not on file  Occupational History  . Not on file  Tobacco Use  . Smoking status: Current Every Day Smoker    Packs/day: 1.00    Years: 40.00    Pack years: 40.00    Types: Cigarettes  . Smokeless tobacco: Never Used  Vaping Use  . Vaping Use: Never used  Substance and Sexual Activity  . Alcohol use: Yes    Comment: 1 drink every other day  . Drug use: No  . Sexual activity: Not on file  Other Topics Concern  . Not on file  Social History Narrative  . Not on file   Social Determinants of Health   Financial Resource Strain: Not on file  Food Insecurity: Not on file  Transportation Needs: Not on file  Physical Activity: Not on file  Stress: Not on file  Social Connections: Not on file  Intimate Partner Violence: Not on file    Subjective: Review of Systems  Constitutional: Negative for chills, fever, malaise/fatigue and weight loss.  HENT: Negative for congestion and sore throat.   Respiratory: Negative for cough and shortness of breath.   Cardiovascular: Negative for chest pain and palpitations.  Gastrointestinal: Positive for abdominal pain and diarrhea. Negative for blood in stool, melena, nausea and vomiting.  Musculoskeletal: Negative for joint pain and myalgias.  Skin: Negative for rash.  Neurological: Negative for dizziness and weakness.  Endo/Heme/Allergies: Does not bruise/bleed easily.  Psychiatric/Behavioral: Negative for depression. The patient is not nervous/anxious.   All other systems reviewed  and are negative.      Objective: BP (!) 192/90   Pulse 84   Temp (!) 97.1 F (36.2 C)   Ht _0  (1.702 m)   Wt 169 lb 9.6 oz (76.9 kg)   BMI 26.56 kg/m  Physical Exam Vitals and nursing note reviewed.  Constitutional:      General: She is not in acute distress.    Appearance: Normal appearance. She is well-developed and normal weight. She is not ill-appearing, toxic-appearing or diaphoretic.  HENT:     Head: Normocephalic and atraumatic.     Nose: No congestion or rhinorrhea.  Eyes:     General: No scleral icterus. Cardiovascular:     Rate and Rhythm: Normal rate and regular rhythm.     Heart sounds: Normal heart sounds.  Pulmonary:     Effort: Pulmonary effort is normal. No respiratory distress.  Breath sounds: Normal breath sounds.  Abdominal:     General: Bowel sounds are normal.     Palpations: Abdomen is soft. There is no hepatomegaly, splenomegaly or mass.     Tenderness: There is no abdominal tenderness. There is no guarding or rebound.     Hernia: No hernia is present.  Skin:    General: Skin is warm and dry.     Coloration: Skin is not jaundiced.     Findings: No rash.  Neurological:     General: No focal deficit present.     Mental Status: She is alert and oriented to person, place, and time.  Psychiatric:        Attention and Perception: Attention normal.        Mood and Affect: Mood normal.        Speech: Speech normal.        Behavior: Behavior normal.        Thought Content: Thought content normal.        Cognition and Memory: Cognition and memory normal.      Assessment:  Very pleasant 60 year old female presents for evaluation of new onset (within the last month) diarrhea and abdominal discomfort.  Denies sick contacts, change in medications, change in diet, recent travel, well water.  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  Given the fact that she still having persistent watery stools, 6 days a  day, we will plan for stool studies.  With her abdominal discomfort, if stool studies are negative, we can recommend Bentyl.  She is due for colonoscopy next year and given her recent change in stools, change in stool caliber (as per HPI) I will also plan to set her up for colonoscopy about 1 year earlier than previously recommended.  Further recommendations will follow stool studies, colonoscopy.   Proceed with colonoscopy on propofol/MAC by Dr. Gala Romney in near future: the risks, benefits, and alternatives have been discussed with the patient in detail. The patient states understanding and desires to proceed.   The patient is currently on Prozac. The patient is not on any other anticoagulants, anxiolytics, chronic pain medications, antidepressants, antidiabetics, or iron supplements.  Given her medication and previous elevated sedation the, we will plan for the procedure on propofol/MAC to promote adequate sedation.  ASA II  Plan: 1. Colonoscopy as described above 2. C. difficile, GI pathogen panel 3. If stool studies are normal can trial Bentyl, Lomotil, or the like 4. Further recommendations to follow 5. Follow-up in 2 months    Thank you for allowing [place] to participate in the care of [place], DNP, AGNP-C Adult & Gerontological Nurse Practitioner [facility]   07/08/2020 2:45 PM   Disclaimer: This note was dictated with voice recognition software. Similar sounding words can inadvertently be transcribed and may not be corrected upon review.

## 2020-08-13 ENCOUNTER — Telehealth: Payer: Self-pay | Admitting: Internal Medicine

## 2020-08-13 NOTE — Telephone Encounter (Signed)
Pt was returning a call from last week regarding her insurance card. I verified what was scanned into her chart back in April. I remember there was some questions about her insurance. She asked to have Reba call her tomorrow before 1pm at [phone]

## 2020-08-28 ENCOUNTER — Encounter: Payer: Self-pay | Admitting: Internal Medicine

## 2020-09-03 ENCOUNTER — Other Ambulatory Visit (HOSPITAL_COMMUNITY): Payer: No Typology Code available for payment source

## 2020-09-04 ENCOUNTER — Other Ambulatory Visit: Payer: Self-pay

## 2020-09-04 ENCOUNTER — Other Ambulatory Visit (HOSPITAL_COMMUNITY)
Admission: RE | Admit: 2020-09-04 | Discharge: 2020-09-04 | Disposition: A | Discharge status: Home / Self Care | Payer: No Typology Code available for payment source | Source: Ambulatory Visit | Attending: Internal Medicine | Admitting: Internal Medicine

## 2020-09-04 DIAGNOSIS — Z01812 Encounter for preprocedural laboratory examination: Secondary | ICD-10-CM | POA: Diagnosis not present

## 2020-09-04 LAB — BASIC METABOLIC PANEL
Anion gap: 9 (ref 5–15)
BUN: 22 mg/dL (ref 8–23)
CO2: 20 mmol/L — ABNORMAL LOW (ref 22–32)
Calcium: 8.9 mg/dL (ref 8.9–10.3)
Chloride: 107 mmol/L (ref 98–111)
Creatinine, Ser: 1.04 mg/dL — ABNORMAL HIGH (ref 0.44–1.00)
GFR, Estimated: >60 mL/min (ref >60)
Glucose, Bld: 130 mg/dL — ABNORMAL HIGH (ref 70–99)
Potassium: 3.6 mmol/L (ref 3.5–5.1)
Sodium: 136 mmol/L (ref 135–145)

## 2020-09-05 ENCOUNTER — Encounter (HOSPITAL_COMMUNITY): Payer: Self-pay | Admitting: Internal Medicine

## 2020-09-05 ENCOUNTER — Other Ambulatory Visit: Payer: Self-pay

## 2020-09-05 ENCOUNTER — Ambulatory Visit (HOSPITAL_COMMUNITY): Payer: PRIVATE HEALTH INSURANCE | Admitting: Anesthesiology

## 2020-09-05 ENCOUNTER — Encounter (HOSPITAL_COMMUNITY)
Admission: RE | Disposition: A | Discharge status: Home / Self Care | Payer: Self-pay | Source: Home / Self Care | Attending: Internal Medicine

## 2020-09-05 ENCOUNTER — Ambulatory Visit (HOSPITAL_COMMUNITY)
Admission: RE | Admit: 2020-09-05 | Discharge: 2020-09-05 | Disposition: A | Discharge status: Home / Self Care | Payer: PRIVATE HEALTH INSURANCE | Attending: Internal Medicine | Admitting: Internal Medicine

## 2020-09-05 DIAGNOSIS — Z7982 Long term (current) use of aspirin: Secondary | ICD-10-CM | POA: Insufficient documentation

## 2020-09-05 DIAGNOSIS — Z79899 Other long term (current) drug therapy: Secondary | ICD-10-CM | POA: Insufficient documentation

## 2020-09-05 DIAGNOSIS — D124 Benign neoplasm of descending colon: Secondary | ICD-10-CM | POA: Insufficient documentation

## 2020-09-05 DIAGNOSIS — D125 Benign neoplasm of sigmoid colon: Secondary | ICD-10-CM | POA: Diagnosis not present

## 2020-09-05 DIAGNOSIS — Z888 Allergy status to other drugs, medicaments and biological substances status: Secondary | ICD-10-CM | POA: Insufficient documentation

## 2020-09-05 DIAGNOSIS — K635 Polyp of colon: Secondary | ICD-10-CM | POA: Insufficient documentation

## 2020-09-05 DIAGNOSIS — Z86010 Personal history of colonic polyps: Secondary | ICD-10-CM

## 2020-09-05 DIAGNOSIS — F17210 Nicotine dependence, cigarettes, uncomplicated: Secondary | ICD-10-CM | POA: Insufficient documentation

## 2020-09-05 DIAGNOSIS — Z09 Encounter for follow-up examination after completed treatment for conditions other than malignant neoplasm: Secondary | ICD-10-CM | POA: Diagnosis present

## 2020-09-05 HISTORY — PX: BIOPSY: SHX5522

## 2020-09-05 HISTORY — PX: COLONOSCOPY WITH PROPOFOL: SHX5780

## 2020-09-05 HISTORY — PX: POLYPECTOMY: SHX5525

## 2020-09-05 SURGERY — COLONOSCOPY WITH PROPOFOL
Anesthesia: General

## 2020-09-05 MED ORDER — LIDOCAINE HCL (CARDIAC) PF 100 MG/5ML IV SOSY
PREFILLED_SYRINGE | INTRAVENOUS | Status: DC | PRN
  Administered 2020-09-05: 30 mg via INTRAVENOUS

## 2020-09-05 MED ORDER — LACTATED RINGERS IV SOLN
INTRAVENOUS | Status: DC
  Administered 2020-09-05: 1000 mL via INTRAVENOUS

## 2020-09-05 MED ORDER — PROPOFOL 10 MG/ML IV BOLUS
INTRAVENOUS | Status: DC | PRN
  Administered 2020-09-05: 150 ug/kg/min via INTRAVENOUS
  Administered 2020-09-05: 100 mg via INTRAVENOUS

## 2020-09-05 NOTE — Anesthesia Postprocedure Evaluation (Signed)
Anesthesia Post Note  Patient: Jennifer Browning  Procedure(s) Performed: COLONOSCOPY WITH PROPOFOL POLYPECTOMY BIOPSY  Patient location during evaluation: Endoscopy Anesthesia Type: General Level of consciousness: awake and alert and oriented Pain management: pain level controlled Vital Signs Assessment: post-procedure vital signs reviewed and stable Respiratory status: spontaneous breathing and respiratory function stable Cardiovascular status: stable and blood pressure returned to baseline Postop Assessment: no apparent nausea or vomiting Anesthetic complications: no   No notable events documented.   Last Vitals:  Vitals:   09/05/20 0922 09/05/20 0938  BP: (!) 101/56 110/65  Pulse: 66   Resp: 16   Temp: 36.4 C   SpO2: 99%     Last Pain:  Vitals:   09/05/20 0938  TempSrc:   PainSc: 0-No pain                 Heston Widener C Taino Maertens

## 2020-09-05 NOTE — Anesthesia Preprocedure Evaluation (Addendum)
Anesthesia Evaluation  Patient identified by MRN, date of birth, ID band Patient awake    Reviewed: Allergy & Precautions, NPO status , Patient's Chart, lab work & pertinent test results, reviewed documented beta blocker date and time   History of Anesthesia Complications Negative for: history of anesthetic complications  Airway Mallampati: II  TM Distance: >3 FB Neck ROM: Full    Dental  (+) Dental Advisory Given, Caps Crowns :   Pulmonary Current SmokerPatient did not abstain from smoking.,    Pulmonary exam normal breath sounds clear to auscultation       Cardiovascular Exercise Tolerance: Good hypertension, Pt. on medications and Pt. on home beta blockers Normal cardiovascular exam Rhythm:Regular Rate:Normal     Neuro/Psych    GI/Hepatic Neg liver ROS, GERD  Medicated and Controlled,  Endo/Other  negative endocrine ROS  Renal/GU negative Renal ROS     Musculoskeletal negative musculoskeletal ROS (+)   Abdominal   Peds  Hematology negative hematology ROS (+)   Anesthesia Other Findings   Reproductive/Obstetrics negative OB ROS                            Anesthesia Physical Anesthesia Plan  ASA: 2  Anesthesia Plan: General   Post-op Pain Management:    Induction:   PONV Risk Score and Plan: Propofol infusion  Airway Management Planned: Nasal Cannula and Natural Airway  Additional Equipment:   Intra-op Plan:   Post-operative Plan:   Informed Consent: I have reviewed the patients History and Physical, chart, labs and discussed the procedure including the risks, benefits and alternatives for the proposed anesthesia with the patient or authorized representative who has indicated his/her understanding and acceptance.     Dental advisory given  Plan Discussed with: CRNA and Surgeon  Anesthesia Plan Comments:        Anesthesia Quick Evaluation

## 2020-09-05 NOTE — H&P (Signed)
@LOGO @   Primary Care Physician:  Ludwig Clarks, FNP Primary Gastroenterologist:  Dr. Gala Romney  Pre-Procedure History & Physical: HPI:  Jennifer Browning is a 60 y.o. female here for further evaluation of nonbloody watery diarrhea.  Stool studies not completed as recommended previously. Patient states she did not submit stool samples because diarrhea resolved.  No diarrhea in 4 months.  Bowel function normal as reported. History of colonic adenomas.  Here for colonoscopy for plan.  Past Medical History:  Diagnosis Date   GERD (gastroesophageal reflux disease)    Hypercholesterolemia    Hypertension    Seasonal allergies     Past Surgical History:  Procedure Laterality Date   APPENDECTOMY     COLONOSCOPY  07/08/2010   Dr. Nautica Hotz:Normal rectum, polyp at the ileocecal valve status post piecemeal snare polypectomy followed by resolution and clipping Remainder of colonic mucosa appeared normal. PATH: tubular adenoma   COLONOSCOPY N/A 11/14/2013   Procedure: COLONOSCOPY;  Surgeon: Daneil Dolin, MD;  Location: [facility];  Service: Endoscopy;  Laterality: N/A;  8:30   COLONOSCOPY N/A 12/16/2016   Procedure: COLONOSCOPY;  Surgeon: Daneil Dolin, MD;  Location: [facility];  Service: Endoscopy;  Laterality: N/A;  1200   POLYPECTOMY  12/16/2016   Procedure: POLYPECTOMY-ASCENDING COLON;  Surgeon: Daneil Dolin, MD;  Location: [facility];  Service: Endoscopy;;    Prior to Admission medications   Medication Sig Start Date End Date Taking? Authorizing Provider  aspirin EC 81 MG tablet Take 81 mg by mouth daily. Swallow whole.   Yes [provider]  atorvastatin (LIPITOR) 80 MG tablet Take 80 mg by mouth daily.   Yes [provider]  Biotin 5000 MCG TABS Take 5,000 mcg by mouth daily.   Yes [provider]  cetirizine (ZYRTEC) 10 MG tablet Take 10 mg by mouth daily as needed for allergies.   Yes [provider]  Cod Liver Oil CAPS Take 3 capsules  by mouth daily with breakfast.   Yes [provider]  FLUoxetine (PROZAC) 40 MG capsule Take 80 mg by mouth daily with breakfast.   Yes [provider]  loratadine (CLARITIN) 10 MG tablet Take 10 mg by mouth daily as needed for allergies or rhinitis.   Yes [provider]  metoprolol tartrate (LOPRESSOR) 100 MG tablet Take 100 mg by mouth 2 (two) times daily.   Yes [provider]  Multiple Vitamin (MULTIVITAMIN) tablet Take 1 tablet by mouth daily.   Yes [provider]  omeprazole (PRILOSEC) 20 MG capsule Take 20 mg by mouth daily before breakfast.   Yes [provider]  polyethylene glycol-electrolytes (TRILYTE) 420 g solution Take 4,000 mLs by mouth as directed. 07/08/20  Yes Renly Guedes, Cristopher Estimable, MD  spironolactone (ALDACTONE) 50 MG tablet Take 50 mg by mouth daily with breakfast.   Yes [provider]  Vitamin D, Ergocalciferol, (DRISDOL) 1.25 MG (50000 UNIT) CAPS capsule Take 50,000 Units by mouth 2 (two) times a week. Tues and Thurs   Yes [provider]  ibuprofen (ADVIL,MOTRIN) 200 MG tablet Take 400 mg by mouth every 8 (eight) hours as needed for moderate pain.    [provider]    Allergies as of 07/08/2020 - Review Complete 07/08/2020  Allergen Reaction Noted   Benazepril  11/08/2017    Family History  Problem Relation Age of Onset   Colon cancer Neg Hx     Social History   Socioeconomic History  Marital status: Married    Spouse name: Not on file   Number of children: Not on file   Years of education: Not on file   Highest education level: Not on file  Occupational History   Not on file  Tobacco Use   Smoking status: Every Day    Packs/day: 1.00    Years: 40.00    Pack years: 40.00    Types: Cigarettes   Smokeless tobacco: Never  Vaping Use   Vaping Use: Never used  Substance and Sexual Activity   Alcohol use: Yes    Comment: 1 drink every other day   Drug use: No   Sexual activity:  Not on file  Other Topics Concern   Not on file  Social History Narrative   Not on file   Social Determinants of Health   Financial Resource Strain: Not on file  Food Insecurity: Not on file  Transportation Needs: Not on file  Physical Activity: Not on file  Stress: Not on file  Social Connections: Not on file  Intimate Partner Violence: Not on file    Review of Systems: See HPI, otherwise negative ROS  Physical Exam: BP (!) 183/83   Pulse (!) 59   Temp 98 F (36.7 C) (Oral)   Resp 15   Ht 5\' 4"  (1.626 m)   Wt 78 kg   SpO2 99%   BMI 29.52 kg/m  General:   Alert,  Well-developed, well-nourished, pleasant and cooperative in NAD Neck:  Supple; no masses or thyromegaly. No significant cervical adenopathy. Lungs:  Clear throughout to auscultation.   No wheezes, crackles, or rhonchi. No acute distress. Heart:  Regular rate and rhythm; no murmurs, clicks, rubs,  or gallops. Abdomen: Non-distended, normal bowel sounds.  Soft and nontender without appreciable mass or hepatosplenomegaly.  Pulses:  Normal pulses noted. Extremities:  Without clubbing or edema.  Impression/Plan: 60 year old lady with a history of protracted nonbloody diarrhea now resolved.  History colonic adenoma.  Here for a colonoscopy per plan.  The risks, benefits, limitations, alternatives and imponderables have been reviewed with the patient. Questions have been answered. All parties are agreeable.      Notice: This dictation was prepared with Dragon dictation along with smaller phrase technology. Any transcriptional errors that result from this process are unintentional and may not be corrected upon review.

## 2020-09-05 NOTE — Op Note (Signed)
[facility] Patient Name: Jennifer Browning Procedure Date: 09/05/2020 8:58 AM MRN: [id number] Date of Birth: 09/15/1960 Attending MD: Norvel Richards , MD CSN: [id number] Age: 60 Admit Type: Outpatient Procedure:                Colonoscopy Indications:              High risk colon cancer surveillance: Personal                            history of colonic polyps; protracted diarrhea?"now                            resolved per patient. Providers:                Norvel Richards, MD, Jeanann Lewandowsky. Sharon Seller, RN,                            Nelma Rothman, Technician Referring MD:              Medicines:                Propofol per Anesthesia Complications:            No immediate complications. Estimated Blood Loss:     Estimated blood loss was minimal. Procedure:                Pre-Anesthesia Assessment:                           - Prior to the procedure, a History and Physical                            was performed, and patient medications and                            allergies were reviewed. The patient's tolerance of                            previous anesthesia was also reviewed. The risks                            and benefits of the procedure and the sedation                            options and risks were discussed with the patient.                            All questions were answered, and informed consent                            was obtained. Prior Anticoagulants: The patient has                            taken no previous anticoagulant or antiplatelet  agents. ASA Grade Assessment: II - A patient with                            mild systemic disease. After reviewing the risks                            and benefits, the patient was deemed in                            satisfactory condition to undergo the procedure.                           After obtaining informed consent, the colonoscope                            was passed  under direct vision. Throughout the                            procedure, the patient's blood pressure, pulse, and                            oxygen saturations were monitored continuously. The                            CF-[id number] ([id number]) scope was introduced through                            the anus and advanced to the 10 cm into the ileum.                            The colonoscopy was performed without difficulty.                            The patient tolerated the procedure well. The                            quality of the bowel preparation was adequate. Scope In: 9:02:53 AM Scope Out: 9:19:32 AM Scope Withdrawal Time: 0 hours 9 minutes 7 seconds  Total Procedure Duration: 0 hours 16 minutes 39 seconds  Findings:      The perianal and digital rectal examinations were normal.      Four semi-pedunculated polyps were found in the sigmoid colon and       ascending colon. The polyps were 4 to 7 mm in size. These polyps were       removed with a cold snare. Resection and retrieval were complete.       Estimated blood loss was minimal.      The exam was otherwise without abnormality on direct and retroflexion       views. Segmental biopsies right and left colon taken for histologic       study. Distal 10 cm of TI. Normal Impression:               - Four 4 to 7 mm polyps in the sigmoid colon and in  the ascending colon, removed with a cold snare.                            Resected and retrieved.                           - The examination was otherwise normal on direct                            and retroflexion views. Normal-appearing TI. Status                            post segmental biopsy. Moderate Sedation:      Moderate (conscious) sedation was personally administered by an       anesthesia professional. The following parameters were monitored: oxygen       saturation, heart rate, blood pressure, respiratory rate, EKG, adequacy       of pulmonary  ventilation, and response to care. Recommendation:           - Patient has a contact number available for                            emergencies. The signs and symptoms of potential                            delayed complications were discussed with the                            patient. Return to normal activities tomorrow.                            Written discharge instructions were provided to the                            patient.                           - Resume previous diet.                           - Continue present medications.                           - Repeat colonoscopy date to be determined after                            pending pathology results are reviewed for                            surveillance.                           - Return to GI office in 2 months. Procedure Code(s):        --- Professional ---  45385, Colonoscopy, flexible; with removal of                            tumor(s), polyp(s), or other lesion(s) by snare                            technique Diagnosis Code(s):        --- Professional ---                           Z86.010, Personal history of colonic polyps                           K63.5, Polyp of colon CPT copyright 2019 [employer] Association. All rights reserved. The codes documented in this report are preliminary and upon coder review may  be revised to meet current compliance requirements. Cristopher Estimable. Treyson Axel, MD Norvel Richards, MD 09/05/2020 9:29:23 AM This report has been signed electronically. Number of Addenda: 0

## 2020-09-05 NOTE — Transfer of Care (Signed)
Immediate Anesthesia Transfer of Care Note  Patient: Jennifer Browning  Procedure(s) Performed: COLONOSCOPY WITH PROPOFOL POLYPECTOMY BIOPSY  Patient Location: Endoscopy Unit  Anesthesia Type:General  Level of Consciousness: drowsy  Airway & Oxygen Therapy: Patient Spontanous Breathing  Post-op Assessment: Report given to RN and Post -op Vital signs reviewed and stable  Post vital signs: Reviewed and stable  Last Vitals:  Vitals Value Taken Time  BP    Temp    Pulse    Resp    SpO2      Last Pain:  Vitals:   09/05/20 0859  TempSrc:   PainSc: 0-No pain      Patients Stated Pain Goal: 5 (83/29/19 1660)  Complications: No notable events documented.

## 2020-09-05 NOTE — Discharge Instructions (Addendum)
Colonoscopy Discharge Instructions  Read the instructions outlined below and refer to this sheet in the next few weeks. These discharge instructions provide you with general information on caring for yourself after you leave the hospital. Your doctor may also give you specific instructions. While your treatment has been planned according to the most current medical practices available, unavoidable complications occasionally occur. If you have any problems or questions after discharge, call Dr. Gala Romney at [phone]. ACTIVITY You may resume your regular activity, but move at a slower pace for the next 24 hours.  Take frequent rest periods for the next 24 hours.  Walking will help get rid of the air and reduce the bloated feeling in your belly (abdomen).  No driving for 24 hours (because of the medicine (anesthesia) used during the test).   Do not sign any important legal documents or operate any machinery for 24 hours (because of the anesthesia used during the test).  NUTRITION Drink plenty of fluids.  You may resume your normal diet as instructed by your doctor.  Begin with a light meal and progress to your normal diet. Heavy or fried foods are harder to digest and may make you feel sick to your stomach (nauseated).  Avoid alcoholic beverages for 24 hours or as instructed.  MEDICATIONS You may resume your normal medications unless your doctor tells you otherwise.  WHAT YOU CAN EXPECT TODAY Some feelings of bloating in the abdomen.  Passage of more gas than usual.  Spotting of blood in your stool or on the toilet paper.  IF YOU HAD POLYPS REMOVED DURING THE COLONOSCOPY: No aspirin products for 7 days or as instructed.  No alcohol for 7 days or as instructed.  Eat a soft diet for the next 24 hours.  FINDING OUT THE RESULTS OF YOUR TEST Not all test results are available during your visit. If your test results are not back during the visit, make an appointment with your caregiver to find out the  results. Do not assume everything is normal if you have not heard from your caregiver or the medical facility. It is important for you to follow up on all of your test results.  SEEK IMMEDIATE MEDICAL ATTENTION IF: You have more than a spotting of blood in your stool.  Your belly is swollen (abdominal distention).  You are nauseated or vomiting.  You have a temperature over 101.  You have abdominal pain or discomfort that is severe or gets worse throughout the day  3 polyps removed in your colon today  Further recommendations to follow pending review of pathology report  At patient request, I called Aniesha Haughn at [phone] -rolled to voicemail.  Left a message.  PATIENT INSTRUCTIONS POST-ANESTHESIA  IMMEDIATELY FOLLOWING SURGERY:  Do not drive or operate machinery for the first twenty four hours after surgery.  Do not make any important decisions for twenty four hours after surgery or while taking narcotic pain medications or sedatives.  If you develop intractable nausea and vomiting or a severe headache please notify your doctor immediately.  FOLLOW-UP:  Please make an appointment with your surgeon as instructed. You do not need to follow up with anesthesia unless specifically instructed to do so.  WOUND CARE INSTRUCTIONS (if applicable):  Keep a dry clean dressing on the anesthesia/puncture wound site if there is drainage.  Once the wound has quit draining you may leave it open to air.  Generally you should leave the bandage intact for twenty four hours unless there is drainage.  If the epidural site drains for more than 36-48 hours please call the anesthesia department.  QUESTIONS?:  Please feel free to call your physician or the hospital operator if you have any questions, and they will be happy to assist you.

## 2020-09-08 LAB — SURGICAL PATHOLOGY

## 2020-09-09 ENCOUNTER — Telehealth: Payer: Self-pay

## 2020-09-09 ENCOUNTER — Encounter: Payer: Self-pay | Admitting: Internal Medicine

## 2020-09-09 NOTE — Telephone Encounter (Signed)
Please NIC for 3 year colonoscopy

## 2020-09-11 ENCOUNTER — Encounter (HOSPITAL_COMMUNITY): Payer: Self-pay | Admitting: Internal Medicine

## 2020-10-06 ENCOUNTER — Ambulatory Visit: Payer: No Typology Code available for payment source | Admitting: Gastroenterology

## 2020-12-31 ENCOUNTER — Encounter: Payer: Self-pay | Admitting: Gastroenterology

## 2020-12-31 NOTE — Progress Notes (Deleted)
Referring Provider: Ludwig Clarks, FNP Primary Care Physician:  Ludwig Clarks, FNP Primary GI Physician: Dr. Gala Romney  No chief complaint on file.   HPI:   Jennifer Browning is a 60 y.o. female presenting today for follow-up of diarrhea, abdominal pain, and nausea with vomiting.  She was last seen in our office for the same on 07/08/2020.  She reported 1 month of abdominal discomfort associated with diarrhea with 5-8 watery stools daily.  She had a lot of nausea the week prior and some vomiting.  Couple episodes of scant toilet tissue hematochezia.  Stool seems to be smaller in caliber when they do have formed.  No other significant GI symptoms.  Plan for stool studies, if negative try Bentyl, and arrange colonoscopy. Stool studies were not completed.Colonoscopy completed in June 2022 with 4 4-7 mm polyps resected and retrieved, otherwise normal exam s/p segmental biopsies.  Pathology revealed tubular adenomas, segmental biopsies with unremarkable colonic mucosa.  Surveillance due in 2025.  Today:    Past Medical History:  Diagnosis Date   GERD (gastroesophageal reflux disease)    Hypercholesterolemia    Hypertension    Seasonal allergies     Past Surgical History:  Procedure Laterality Date   APPENDECTOMY     BIOPSY  09/05/2020   Procedure: BIOPSY;  Surgeon: Daneil Dolin, MD;  Location: [facility];  Service: Endoscopy;;   COLONOSCOPY  07/08/2010   Dr. Rourk:Normal rectum, polyp at the ileocecal valve status post piecemeal snare polypectomy followed by resolution and clipping Remainder of colonic mucosa appeared normal. PATH: tubular adenoma   COLONOSCOPY N/A 11/14/2013   Procedure: COLONOSCOPY;  Surgeon: Daneil Dolin, MD;  Location: [facility];  Service: Endoscopy;  Laterality: N/A;  8:30   COLONOSCOPY N/A 12/16/2016   Procedure: COLONOSCOPY;  Surgeon: Daneil Dolin, MD;  [facility]: [facility];  Service: Endoscopy;  Laterality: N/A;  1200   COLONOSCOPY WITH  PROPOFOL N/A 09/05/2020   Procedure: COLONOSCOPY WITH PROPOFOL;  Surgeon: Daneil Dolin, MD;  Location: [facility];  Service: Endoscopy;  Laterality: N/A;  9:00am   POLYPECTOMY  12/16/2016   Procedure: POLYPECTOMY-ASCENDING COLON;  Surgeon: Daneil Dolin, MD;  [facility]: [facility];  Service: Endoscopy;;   POLYPECTOMY  09/05/2020   Procedure: POLYPECTOMY;  Surgeon: Daneil Dolin, MD;  [facility]: [facility];  Service: Endoscopy;;    Current Outpatient Medications  Medication Sig Dispense Refill   aspirin EC 81 MG tablet Take 81 mg by mouth daily. Swallow whole.     atorvastatin (LIPITOR) 80 MG tablet Take 80 mg by mouth daily.     Biotin 5000 MCG TABS Take 5,000 mcg by mouth daily.     cetirizine (ZYRTEC) 10 MG tablet Take 10 mg by mouth daily as needed for allergies.     Cod Liver Oil CAPS Take 3 capsules by mouth daily with breakfast.     FLUoxetine (PROZAC) 40 MG capsule Take 80 mg by mouth daily with breakfast.     ibuprofen (ADVIL,MOTRIN) 200 MG tablet Take 400 mg by mouth every 8 (eight) hours as needed for moderate pain.     loratadine (CLARITIN) 10 MG tablet Take 10 mg by mouth daily as needed for allergies or rhinitis.     metoprolol tartrate (LOPRESSOR) 100 MG tablet Take 100 mg by mouth 2 (two) times daily.     Multiple Vitamin (MULTIVITAMIN) tablet Take 1 tablet by mouth daily.     omeprazole (  PRILOSEC) 20 MG capsule Take 20 mg by mouth daily before breakfast.     polyethylene glycol-electrolytes (TRILYTE) 420 g solution Take 4,000 mLs by mouth as directed. 4000 mL 0   spironolactone (ALDACTONE) 50 MG tablet Take 50 mg by mouth daily with breakfast.     Vitamin D, Ergocalciferol, (DRISDOL) 1.25 MG (50000 UNIT) CAPS capsule Take 50,000 Units by mouth 2 (two) times a week. Tues and Thurs     No current facility-administered medications for this visit.    Allergies as of 01/01/2021 - Review Complete 09/05/2020  Allergen Reaction Noted   Benazepril Swelling 11/08/2017     Family History  Problem Relation Age of Onset   Colon cancer Neg Hx     Social History   Socioeconomic History   Marital status: Married    Spouse name: Not on file   Number of children: Not on file   Years of education: Not on file   Highest education level: Not on file  Occupational History   Not on file  Tobacco Use   Smoking status: Every Day    Packs/day: 1.00    Years: 40.00    Pack years: 40.00    Types: Cigarettes   Smokeless tobacco: Never  Vaping Use   Vaping Use: Never used  Substance and Sexual Activity   Alcohol use: Yes    Comment: 1 drink every other day   Drug use: No   Sexual activity: Not on file  Other Topics Concern   Not on file  Social History Narrative   Not on file   Social Determinants of Health   Financial Resource Strain: Not on file  Food Insecurity: Not on file  Transportation Needs: Not on file  Physical Activity: Not on file  Stress: Not on file  Social Connections: Not on file    Review of Systems: Gen: Denies fever, chills, anorexia. Denies fatigue, weakness, weight loss.  CV: Denies chest pain, palpitations, syncope, peripheral edema, and claudication. Resp: Denies dyspnea at rest, cough, wheezing, coughing up blood, and pleurisy. GI: Denies vomiting blood, jaundice, and fecal incontinence.   Denies dysphagia or odynophagia. Derm: Denies rash, itching, dry skin Psych: Denies depression, anxiety, memory loss, confusion. No homicidal or suicidal ideation.  Heme: Denies bruising, bleeding, and enlarged lymph nodes.  Physical Exam: There were no vitals taken for this visit. General:   Alert and oriented. No distress noted. Pleasant and cooperative.  Head:  Normocephalic and atraumatic. Eyes:  Conjuctiva clear without scleral icterus. Mouth:  Oral mucosa pink and moist. Good dentition. No lesions. Heart:  S1, S2 present without murmurs appreciated. Lungs:  Clear to auscultation bilaterally. No wheezes, rales, or rhonchi.  No distress.  Abdomen:  +BS, soft, non-tender and non-distended. No rebound or guarding. No HSM or masses noted. Msk:  Symmetrical without gross deformities. Normal posture. Extremities:  Without edema. Neurologic:  Alert and  oriented x4 Psych:  Alert and cooperative. Normal mood and affect.

## 2021-01-01 ENCOUNTER — Ambulatory Visit: Payer: No Typology Code available for payment source | Admitting: Gastroenterology

## 2021-01-01 ENCOUNTER — Encounter: Payer: Self-pay | Admitting: Gastroenterology

## 2021-01-02 ENCOUNTER — Ambulatory Visit: Payer: No Typology Code available for payment source | Admitting: Gastroenterology

## 2021-01-06 ENCOUNTER — Ambulatory Visit: Payer: No Typology Code available for payment source | Admitting: Gastroenterology

## 2021-09-02 ENCOUNTER — Other Ambulatory Visit (HOSPITAL_COMMUNITY): Payer: Self-pay | Admitting: Family Medicine

## 2021-09-02 DIAGNOSIS — Z1231 Encounter for screening mammogram for malignant neoplasm of breast: Secondary | ICD-10-CM

## 2022-04-10 IMAGING — MG DIGITAL SCREENING BILAT W/ TOMO W/ CAD
8 series · 8 of 24 positions shown · non-contrast
Comparison: Previous exam(s).

CLINICAL DATA: Screening.

EXAM:
DIGITAL SCREENING BILATERAL MAMMOGRAM WITH TOMO AND CAD

[R CC synth-2D]
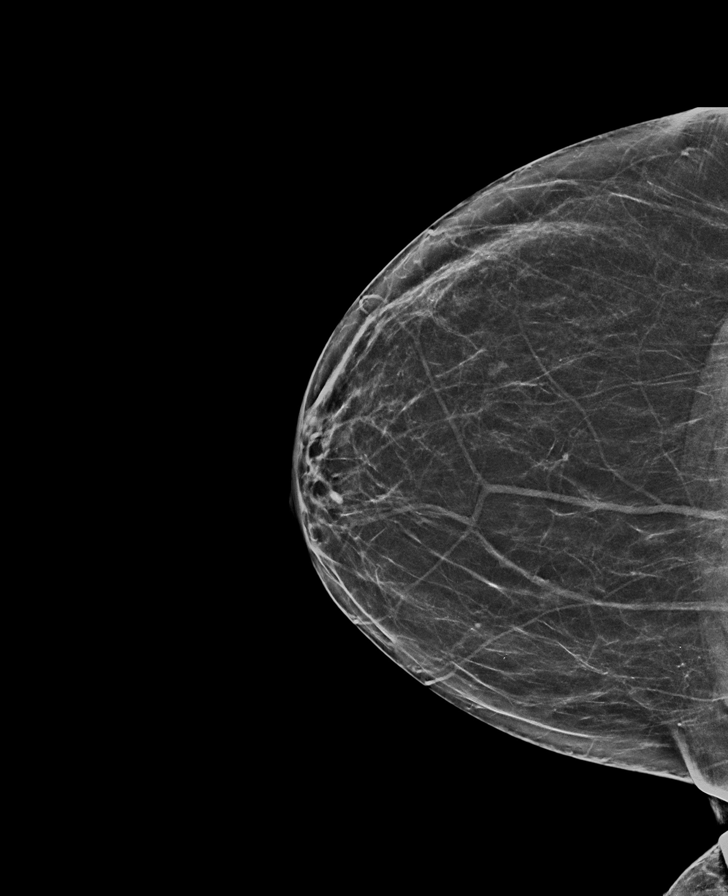

[L CC synth-2D]
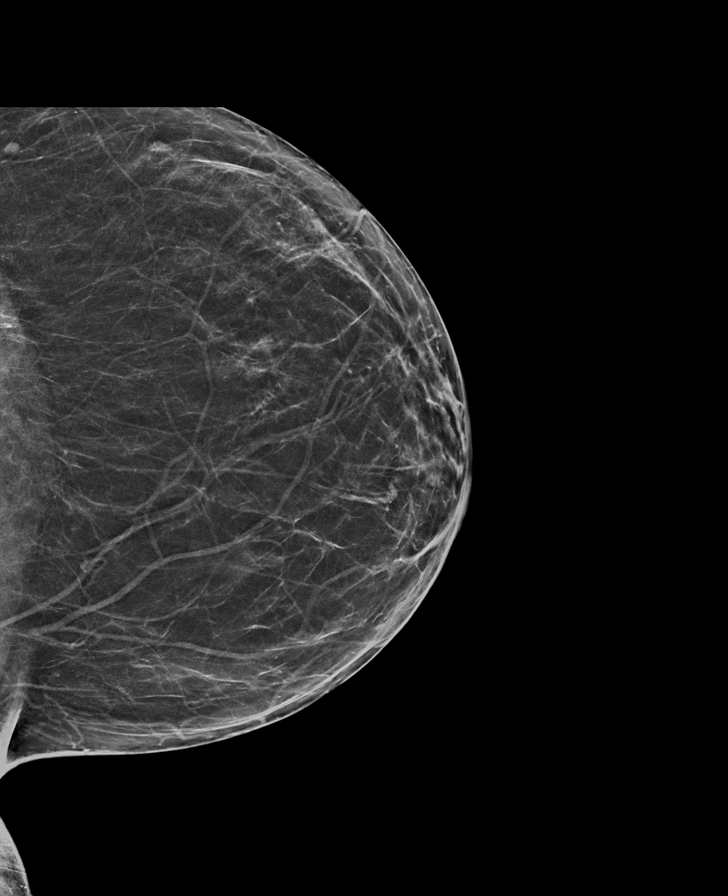

[R MLO synth-2D]
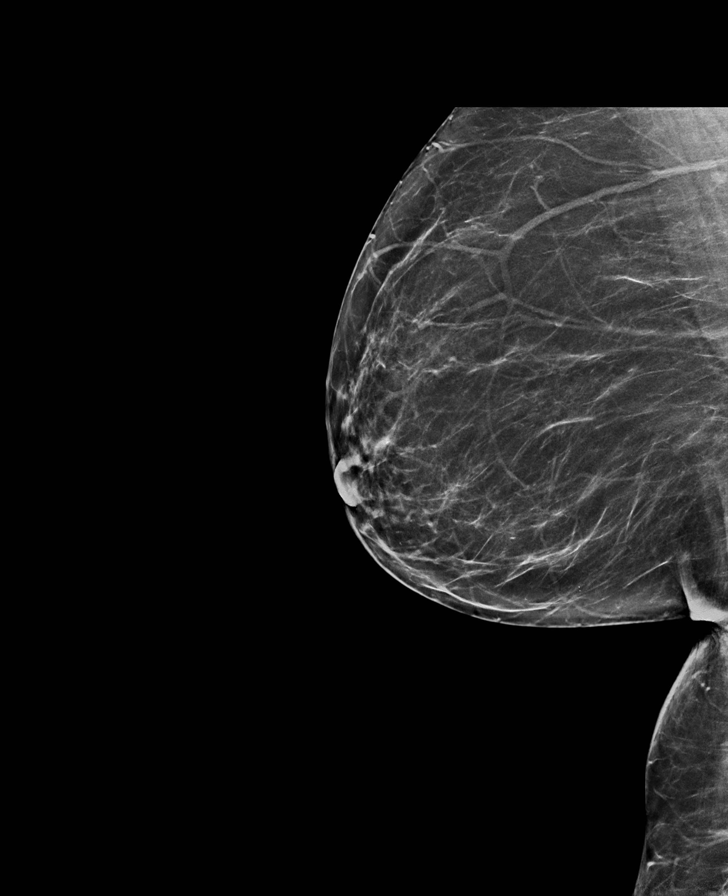

[L MLO synth-2D]
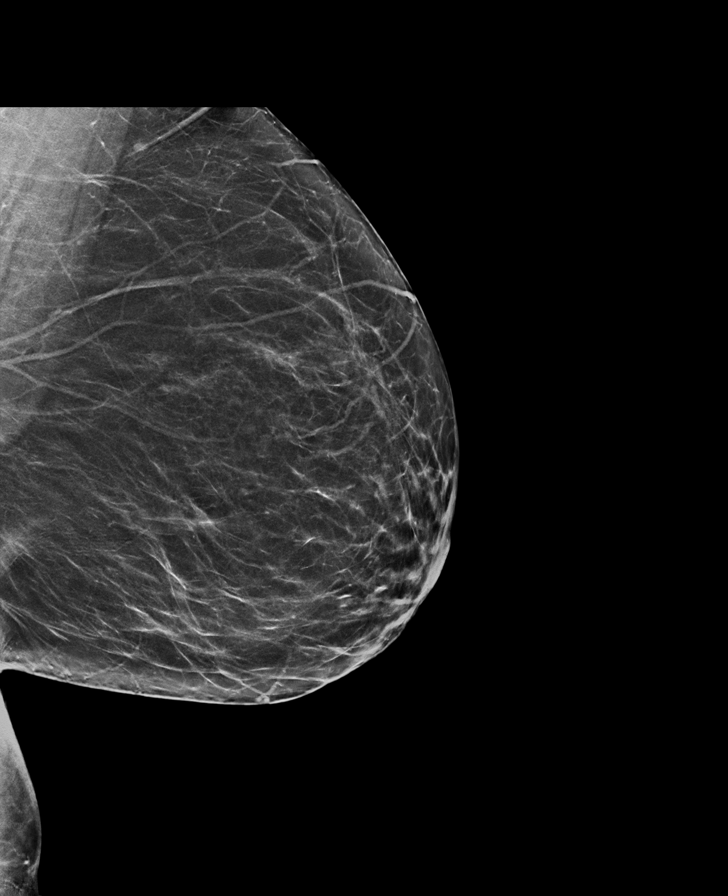

[R CC tomo · tomo slice 30/59.0]
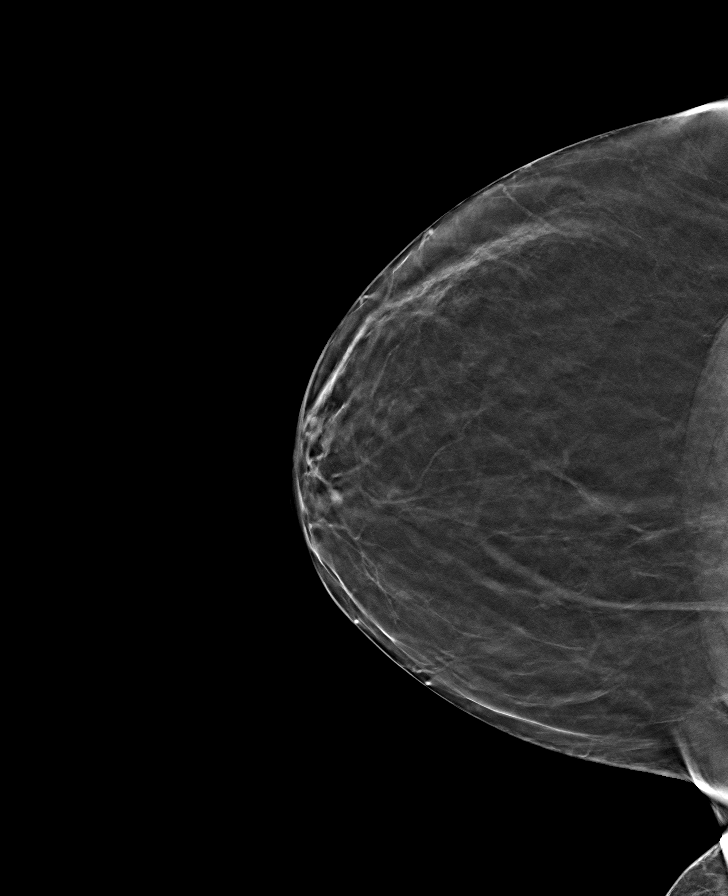

[L MLO tomo · tomo slice 34/67.0]
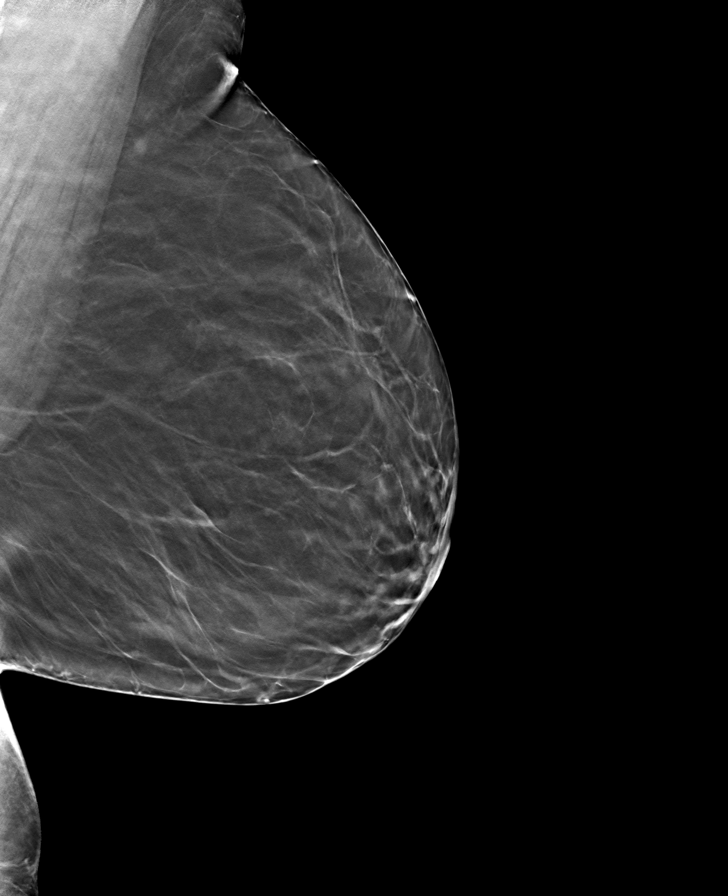

[R MLO tomo · tomo slice 35/69.0]
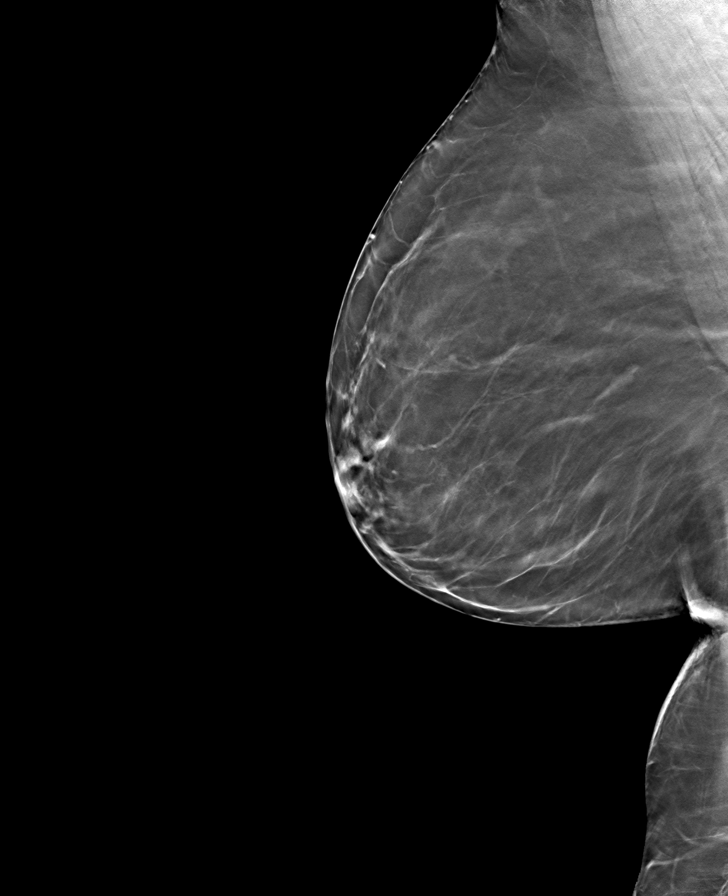

[L CC tomo · tomo slice 31/60.0]
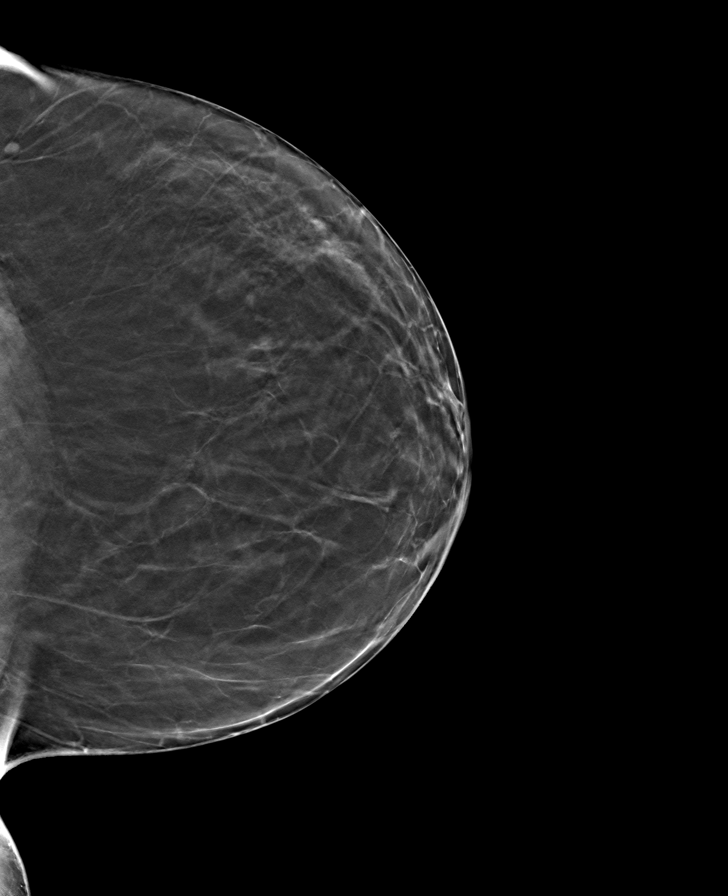

[8 of 24 positions shown; findings below may reference images not displayed]

ACR Breast Density Category b: There are scattered areas of
fibroglandular density.
FINDINGS: There are no findings suspicious for malignancy. Images were
processed with CAD.
IMPRESSION: No mammographic evidence of malignancy. A result letter of this
screening mammogram will be mailed directly to the patient.

RECOMMENDATION:
Screening mammogram in one year. (Code:[id number])

BI-RADS CATEGORY  1: Negative.

## 2022-06-30 ENCOUNTER — Other Ambulatory Visit (HOSPITAL_COMMUNITY): Payer: Self-pay | Admitting: Family Medicine

## 2022-06-30 DIAGNOSIS — Z1231 Encounter for screening mammogram for malignant neoplasm of breast: Secondary | ICD-10-CM

## 2022-07-14 ENCOUNTER — Ambulatory Visit (HOSPITAL_COMMUNITY)
Admission: RE | Admit: 2022-07-14 | Discharge: 2022-07-14 | Disposition: A | Discharge status: Home / Self Care | Payer: No Typology Code available for payment source | Source: Ambulatory Visit | Attending: Family Medicine | Admitting: Family Medicine

## 2022-07-14 DIAGNOSIS — Z1231 Encounter for screening mammogram for malignant neoplasm of breast: Secondary | ICD-10-CM | POA: Insufficient documentation

## 2023-07-27 ENCOUNTER — Encounter: Payer: Self-pay | Admitting: *Deleted
